# Patient Record
Sex: Female | Born: 1980
Health system: Southern US, Community
[De-identification: ages and names within clinical notes are randomized; demographics above are authoritative.]

## PROBLEM LIST (undated history)

## (undated) DIAGNOSIS — Z85850 Personal history of malignant neoplasm of thyroid: Secondary | ICD-10-CM

## (undated) DIAGNOSIS — Z973 Presence of spectacles and contact lenses: Secondary | ICD-10-CM

## (undated) DIAGNOSIS — N939 Abnormal uterine and vaginal bleeding, unspecified: Secondary | ICD-10-CM

## (undated) DIAGNOSIS — E890 Postprocedural hypothyroidism: Secondary | ICD-10-CM

## (undated) DIAGNOSIS — F419 Anxiety disorder, unspecified: Secondary | ICD-10-CM

## (undated) DIAGNOSIS — D250 Submucous leiomyoma of uterus: Secondary | ICD-10-CM

## (undated) DIAGNOSIS — E785 Hyperlipidemia, unspecified: Secondary | ICD-10-CM

## (undated) DIAGNOSIS — R000 Tachycardia, unspecified: Secondary | ICD-10-CM

## (undated) DIAGNOSIS — C801 Malignant (primary) neoplasm, unspecified: Secondary | ICD-10-CM

## (undated) HISTORY — PX: TOTAL THYROIDECTOMY: SHX2547

## (undated) HISTORY — PX: THYROIDECTOMY, PARTIAL: SHX18

---

## 2002-09-25 ENCOUNTER — Other Ambulatory Visit: Admission: RE | Admit: 2002-09-25 | Discharge: 2002-09-25 | Payer: Self-pay | Admitting: Obstetrics and Gynecology

## 2002-12-16 ENCOUNTER — Other Ambulatory Visit: Admission: RE | Admit: 2002-12-16 | Discharge: 2002-12-16 | Payer: Self-pay | Admitting: Diagnostic Radiology

## 2003-01-20 ENCOUNTER — Encounter (INDEPENDENT_AMBULATORY_CARE_PROVIDER_SITE_OTHER): Payer: Self-pay | Admitting: Specialist

## 2003-01-20 ENCOUNTER — Observation Stay (HOSPITAL_COMMUNITY): Admission: RE | Admit: 2003-01-20 | Discharge: 2003-01-21 | Payer: Self-pay | Admitting: Surgery

## 2003-03-07 ENCOUNTER — Inpatient Hospital Stay (HOSPITAL_COMMUNITY): Admission: RE | Admit: 2003-03-07 | Discharge: 2003-03-08 | Payer: Self-pay | Admitting: Surgery

## 2003-03-07 ENCOUNTER — Encounter (INDEPENDENT_AMBULATORY_CARE_PROVIDER_SITE_OTHER): Payer: Self-pay | Admitting: Specialist

## 2003-04-28 ENCOUNTER — Encounter (HOSPITAL_COMMUNITY): Admission: RE | Admit: 2003-04-28 | Discharge: 2003-07-27 | Payer: Self-pay | Admitting: Endocrinology

## 2003-09-30 ENCOUNTER — Other Ambulatory Visit: Admission: RE | Admit: 2003-09-30 | Discharge: 2003-09-30 | Payer: Self-pay | Admitting: Obstetrics and Gynecology

## 2003-12-09 ENCOUNTER — Ambulatory Visit: Payer: Self-pay | Admitting: Internal Medicine

## 2003-12-23 ENCOUNTER — Ambulatory Visit: Payer: Self-pay | Admitting: Internal Medicine

## 2004-02-25 ENCOUNTER — Ambulatory Visit: Payer: Self-pay | Admitting: Internal Medicine

## 2004-03-24 ENCOUNTER — Ambulatory Visit: Payer: Self-pay | Admitting: Internal Medicine

## 2004-04-21 ENCOUNTER — Ambulatory Visit: Payer: Self-pay | Admitting: Internal Medicine

## 2004-04-26 ENCOUNTER — Encounter (HOSPITAL_COMMUNITY): Admission: RE | Admit: 2004-04-26 | Discharge: 2004-07-25 | Payer: Self-pay | Admitting: Endocrinology

## 2004-07-29 ENCOUNTER — Ambulatory Visit: Payer: Self-pay | Admitting: Internal Medicine

## 2004-12-14 ENCOUNTER — Other Ambulatory Visit: Admission: RE | Admit: 2004-12-14 | Discharge: 2004-12-14 | Payer: Self-pay | Admitting: Obstetrics and Gynecology

## 2005-01-19 ENCOUNTER — Ambulatory Visit (HOSPITAL_COMMUNITY): Admission: RE | Admit: 2005-01-19 | Discharge: 2005-01-19 | Payer: Self-pay | Admitting: Endocrinology

## 2005-02-21 ENCOUNTER — Encounter (HOSPITAL_COMMUNITY): Admission: RE | Admit: 2005-02-21 | Discharge: 2005-05-22 | Payer: Self-pay | Admitting: Endocrinology

## 2005-04-21 ENCOUNTER — Ambulatory Visit: Payer: Self-pay | Admitting: Internal Medicine

## 2005-09-22 ENCOUNTER — Ambulatory Visit: Payer: Self-pay | Admitting: Internal Medicine

## 2005-10-24 ENCOUNTER — Encounter: Admission: RE | Admit: 2005-10-24 | Discharge: 2005-10-24 | Payer: Self-pay | Admitting: Endocrinology

## 2005-10-26 ENCOUNTER — Encounter: Admission: RE | Admit: 2005-10-26 | Discharge: 2005-10-26 | Payer: Self-pay | Admitting: Endocrinology

## 2005-11-11 ENCOUNTER — Ambulatory Visit (HOSPITAL_COMMUNITY): Admission: RE | Admit: 2005-11-11 | Discharge: 2005-11-11 | Payer: Self-pay | Admitting: Endocrinology

## 2005-12-13 ENCOUNTER — Ambulatory Visit: Payer: Self-pay | Admitting: Internal Medicine

## 2005-12-13 ENCOUNTER — Emergency Department (HOSPITAL_COMMUNITY): Admission: EM | Admit: 2005-12-13 | Discharge: 2005-12-13 | Payer: Self-pay | Admitting: Emergency Medicine

## 2005-12-14 ENCOUNTER — Encounter: Payer: Self-pay | Admitting: Cardiology

## 2005-12-14 ENCOUNTER — Ambulatory Visit: Payer: Self-pay

## 2005-12-21 ENCOUNTER — Encounter: Admission: RE | Admit: 2005-12-21 | Discharge: 2005-12-21 | Payer: Self-pay | Admitting: Endocrinology

## 2006-03-09 ENCOUNTER — Ambulatory Visit: Payer: Self-pay | Admitting: Internal Medicine

## 2006-09-12 ENCOUNTER — Encounter: Admission: RE | Admit: 2006-09-12 | Discharge: 2006-09-12 | Payer: Self-pay | Admitting: Endocrinology

## 2006-09-21 ENCOUNTER — Encounter: Admission: RE | Admit: 2006-09-21 | Discharge: 2006-09-21 | Payer: Self-pay | Admitting: Endocrinology

## 2006-09-21 ENCOUNTER — Other Ambulatory Visit: Admission: RE | Admit: 2006-09-21 | Discharge: 2006-09-21 | Payer: Self-pay | Admitting: Interventional Radiology

## 2006-09-21 ENCOUNTER — Encounter (INDEPENDENT_AMBULATORY_CARE_PROVIDER_SITE_OTHER): Payer: Self-pay | Admitting: Interventional Radiology

## 2006-10-05 ENCOUNTER — Ambulatory Visit (HOSPITAL_COMMUNITY): Admission: RE | Admit: 2006-10-05 | Discharge: 2006-10-05 | Payer: Self-pay | Admitting: Endocrinology

## 2006-11-03 ENCOUNTER — Ambulatory Visit: Payer: Self-pay | Admitting: Internal Medicine

## 2006-11-03 DIAGNOSIS — B308 Other viral conjunctivitis: Secondary | ICD-10-CM | POA: Insufficient documentation

## 2007-02-05 ENCOUNTER — Ambulatory Visit: Payer: Self-pay | Admitting: Internal Medicine

## 2007-02-05 DIAGNOSIS — J069 Acute upper respiratory infection, unspecified: Secondary | ICD-10-CM | POA: Insufficient documentation

## 2007-02-09 ENCOUNTER — Telehealth: Payer: Self-pay | Admitting: Internal Medicine

## 2007-12-16 ENCOUNTER — Inpatient Hospital Stay (HOSPITAL_COMMUNITY): Admission: AD | Admit: 2007-12-16 | Discharge: 2007-12-18 | Payer: Self-pay | Admitting: Obstetrics and Gynecology

## 2008-01-28 IMAGING — US US SOFT TISSUE HEAD/NECK
1 series · 13 of 13 positions shown · non-contrast
Comparison: 01/19/05

CLINICAL DATA: History thyroidectomy for thyroid cancer.
 ULTRASOUND SOFT TISSUE HEAD/NECK:
TECHNIQUE: Multiplanar gray scale ultrasound was performed of the thyroidectomy bed and surrounding soft tissues.

[Series 1: us soft tissue head/neck · 0.09mm/px · 13 of 13 slices shown]
[im 1/13]
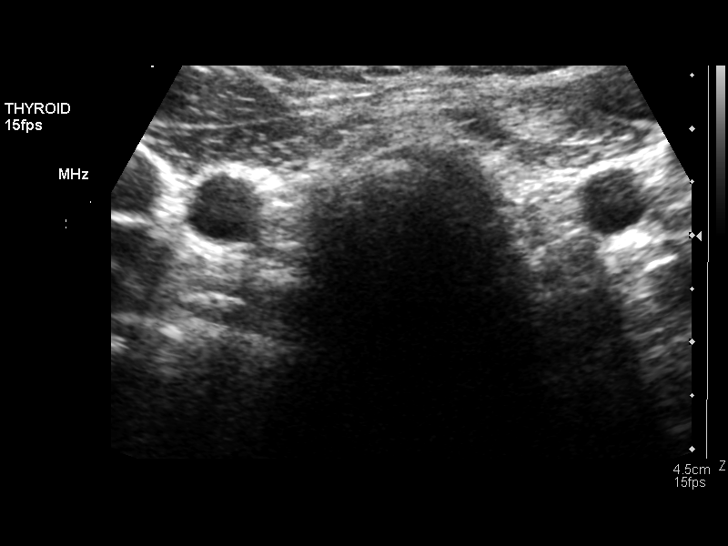
[im 2/13]
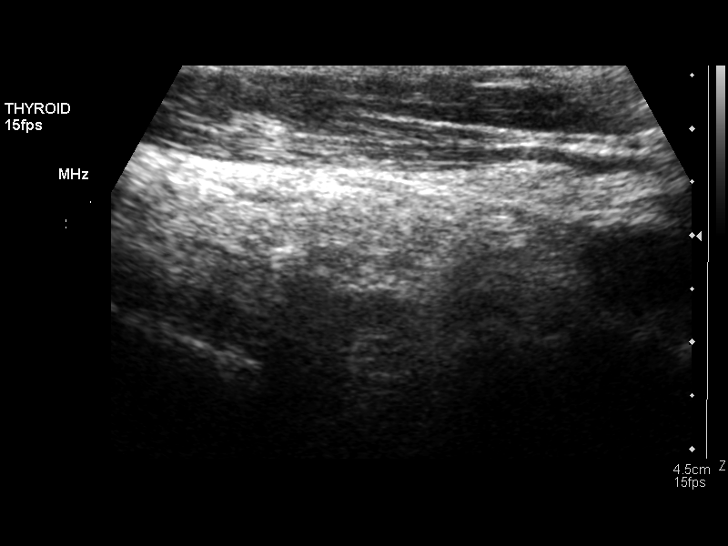
[im 3/13]
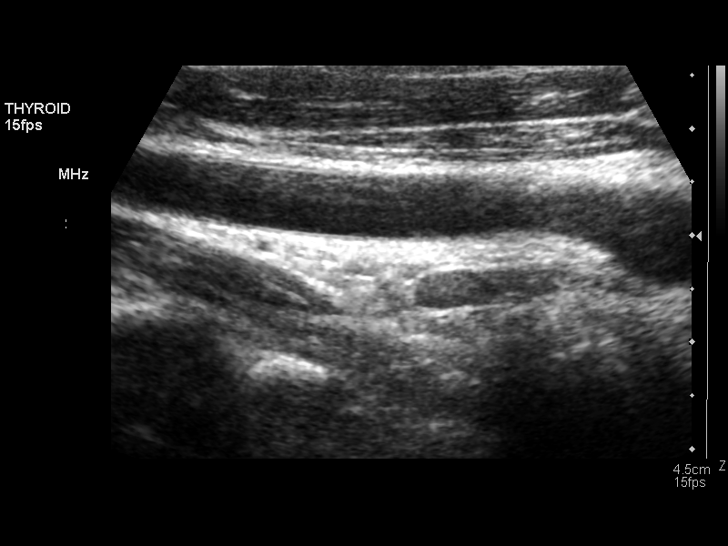
[im 4/13]
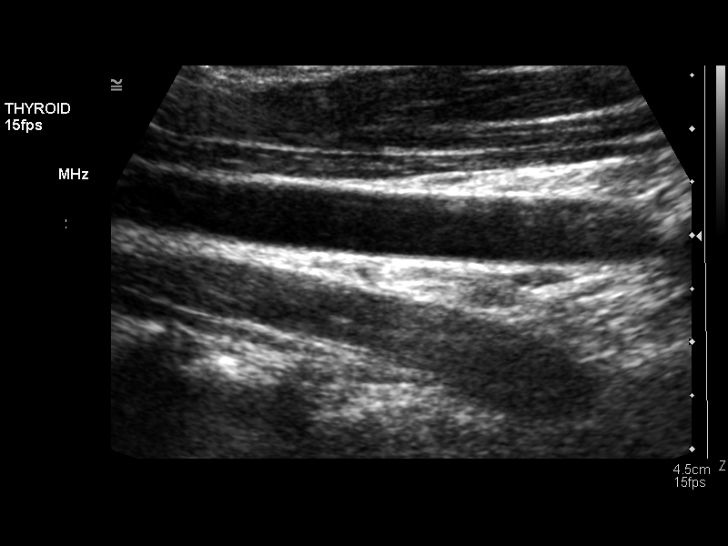
[im 5/13]
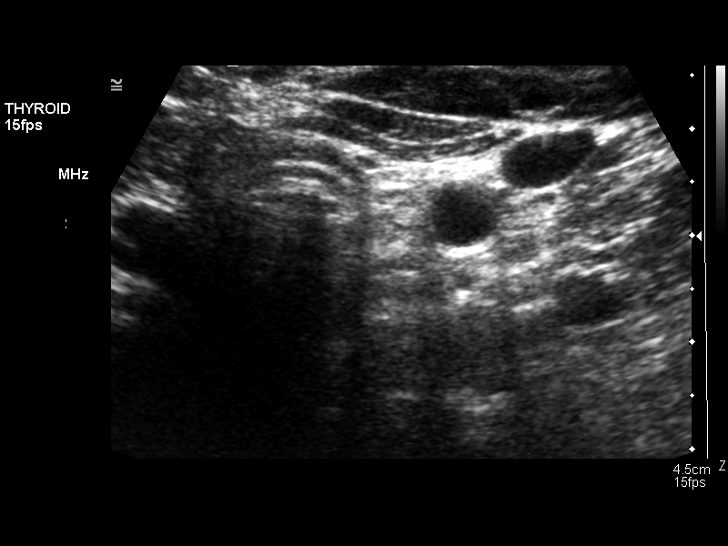
[im 6/13]
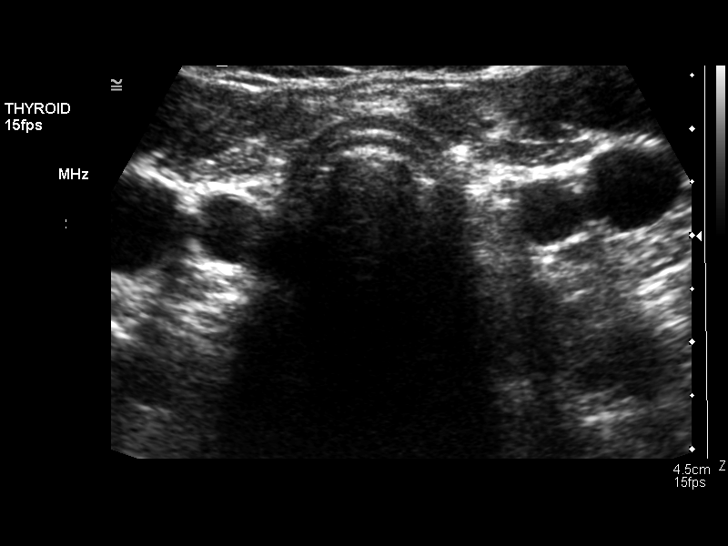
[im 7/13]
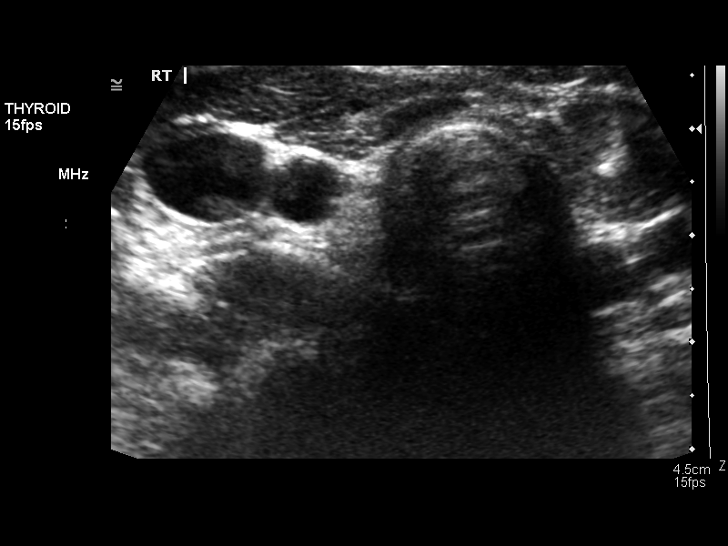
[im 8/13]
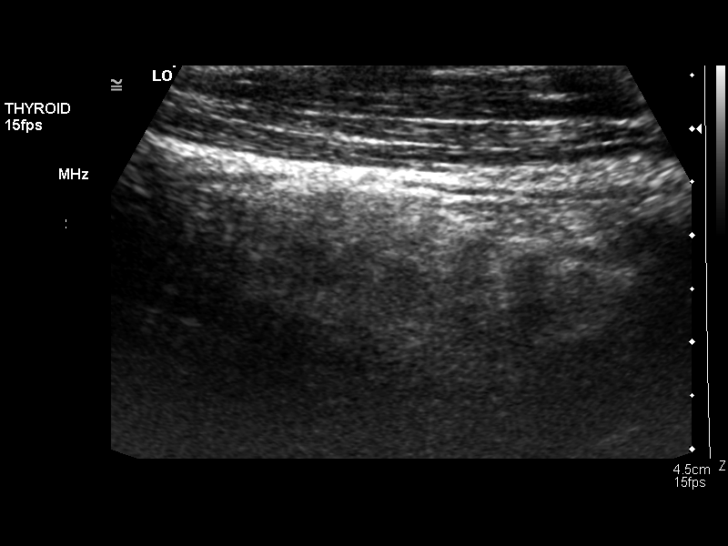
[im 9/13]
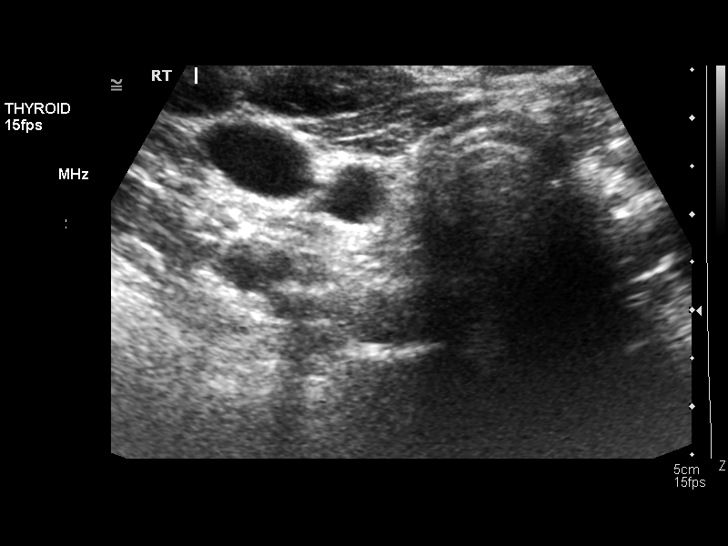
[im 10/13]
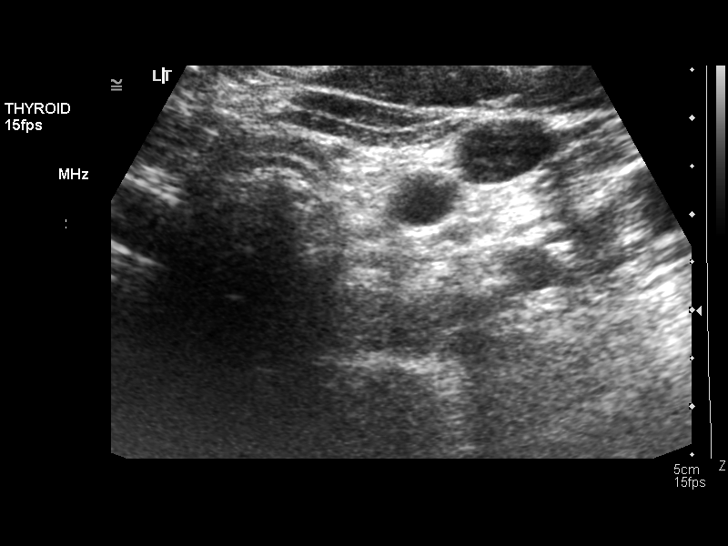
[im 11/13]
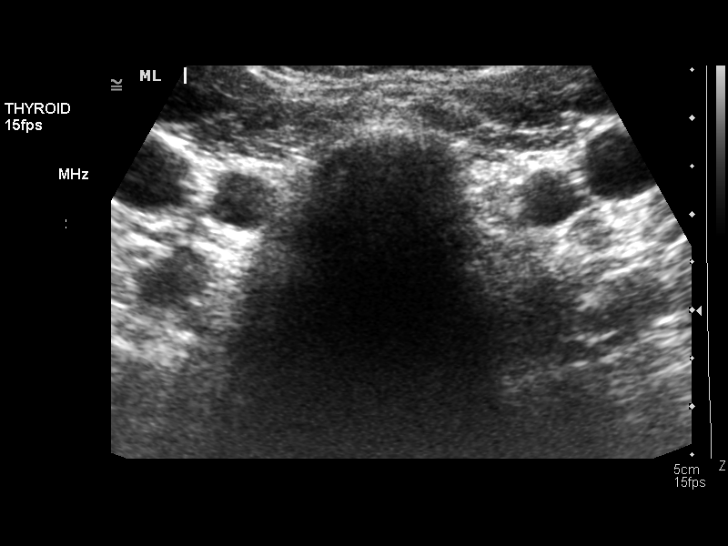
[im 12/13]
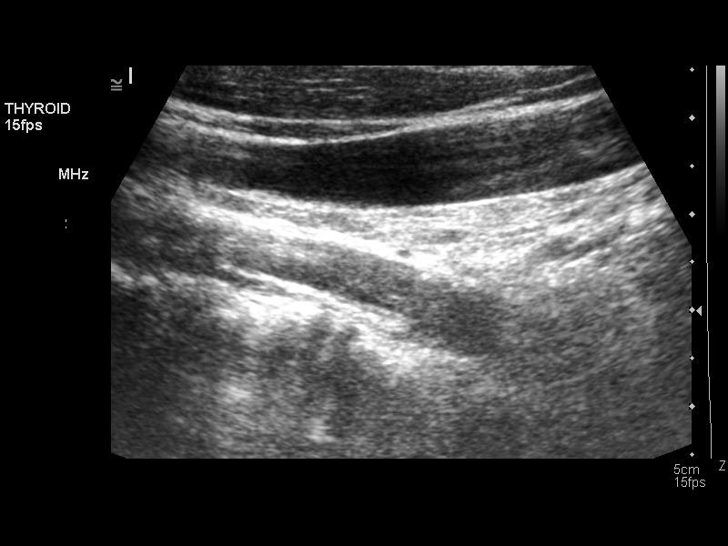
[im 13/13]
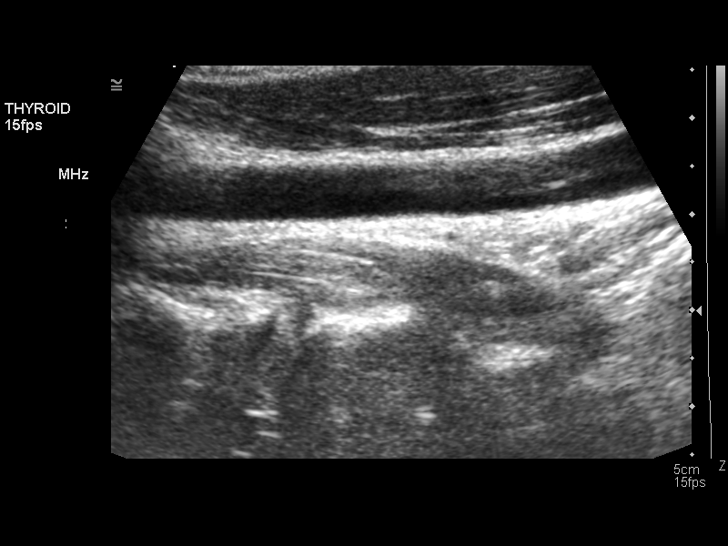

[13 of 13 positions shown; findings below may reference images not displayed]

FINDINGS: The patient is status post thyroidectomy.  There is no ultrasound evidence of mass in the surgical bed to suggest local recurrence.  No surrounding enlarged lymph nodes are identified.
IMPRESSION: Stable appearance of the neck status post thyroidectomy.

## 2008-05-14 ENCOUNTER — Ambulatory Visit: Payer: Self-pay | Admitting: Internal Medicine

## 2008-05-14 DIAGNOSIS — L919 Hypertrophic disorder of the skin, unspecified: Secondary | ICD-10-CM

## 2008-05-14 DIAGNOSIS — L909 Atrophic disorder of skin, unspecified: Secondary | ICD-10-CM | POA: Insufficient documentation

## 2008-08-18 ENCOUNTER — Ambulatory Visit: Payer: Self-pay | Admitting: Internal Medicine

## 2008-08-18 LAB — CONVERTED CEMR LAB: Rapid Strep: NEGATIVE

## 2008-09-08 ENCOUNTER — Encounter (HOSPITAL_COMMUNITY): Admission: RE | Admit: 2008-09-08 | Discharge: 2008-10-23 | Payer: Self-pay | Admitting: Endocrinology

## 2008-12-25 IMAGING — US US BIOPSY
1 series · 14 of 16 positions shown · non-contrast
Comparison: none

CLINICAL DATA: Thyroid carcinoma status post thyroidectomy. Elevated
thyroglobulin. PET-CT and Almi scintigraphy negative. Recent ultrasound
demonstrated bilateral cervical lymph nodes.

ULTRASOUND GUIDED CERVICAL ASPIRATION BIOPSY x2:
TECHNIQUE: Survey ultrasound of the neck was performed and dominant right and
left cervical lymph nodes were identified. Overlying skin prepped with Betadine,
draped in usual sterile fashion, and infiltrated locally with 1% lidocaine.
Under real-time ultrasound guidance, 3  passes were made into a prominent normal
sized right-sided cervical node anterior to the carotid bifurcation using 25
gauge needles.  In similar fashion, under real-time ultrasound guidance, 3 
passes were made into a prominent left-sided cervical node just inferior to the
level of the carotid bifurcation using 25 gauge needles. Samples were given to
cytopathology. No immediate complication.

[Series 1: us biopsy · 16 acquisitions, 14 frames shown]
[im 1/16]
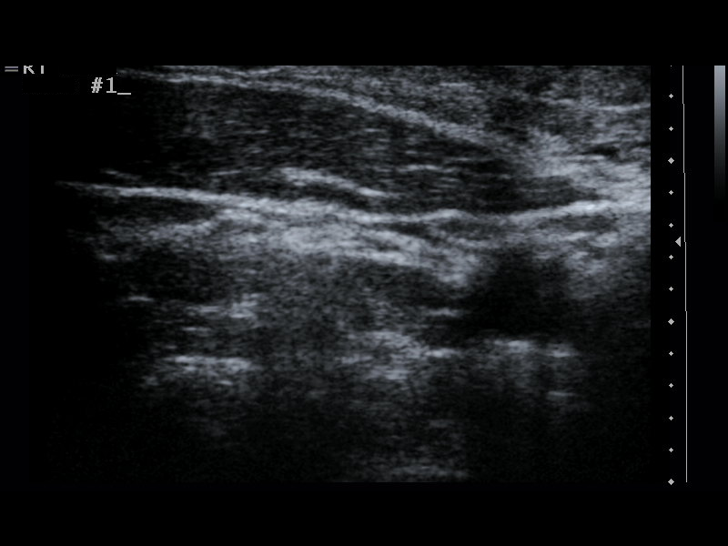
[im 2/16]
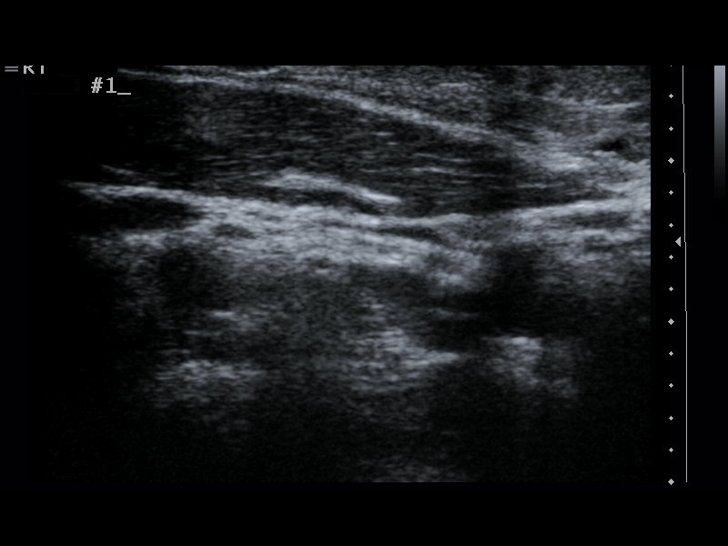
[im 3/16]
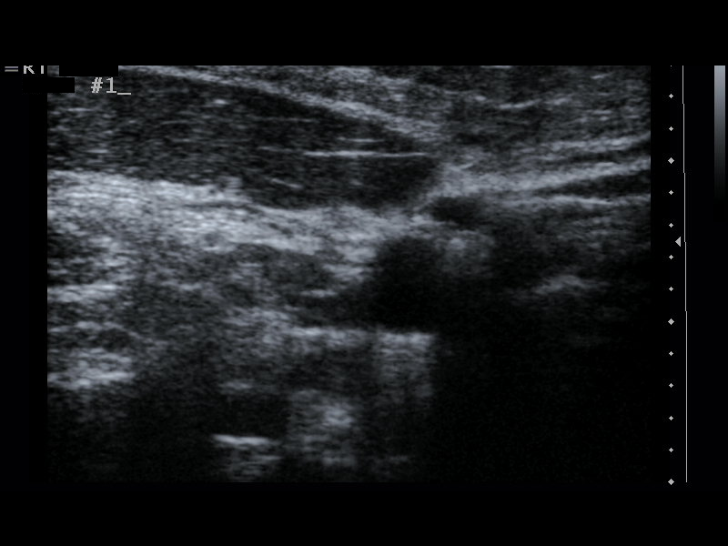
[im 5/16]
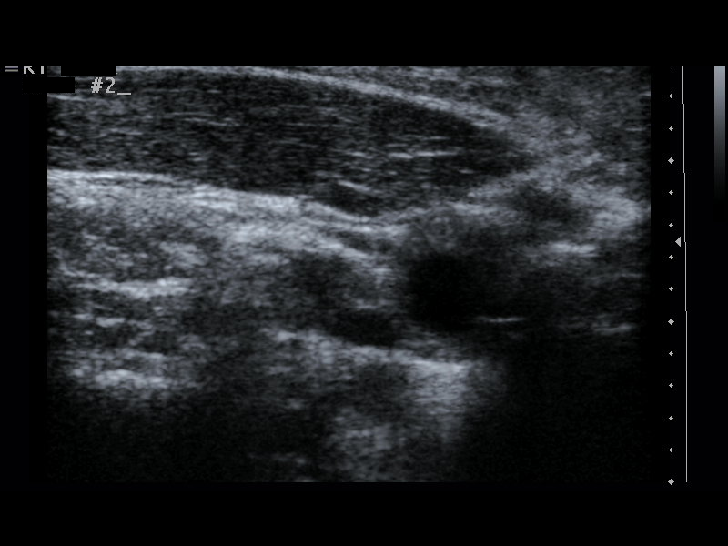
[im 6/16]
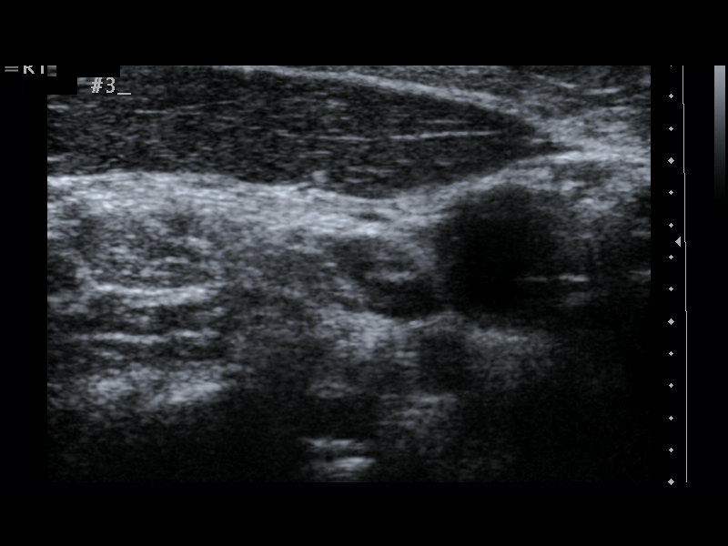
[im 7/16]
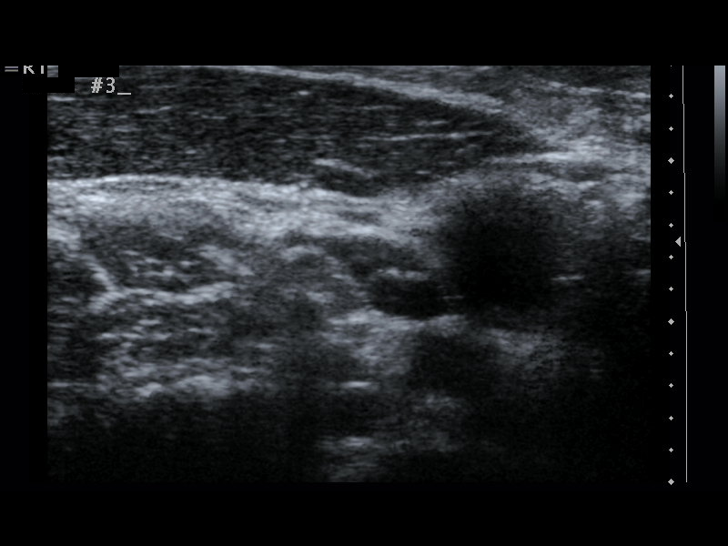
[im 8/16]
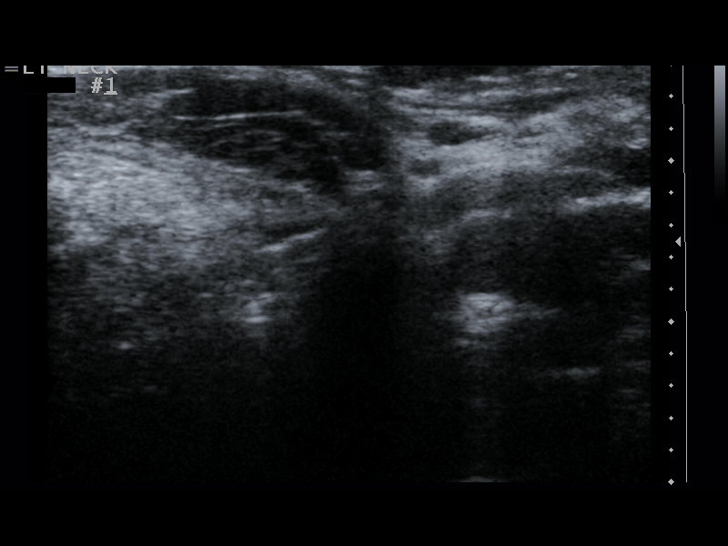
[im 9/16]
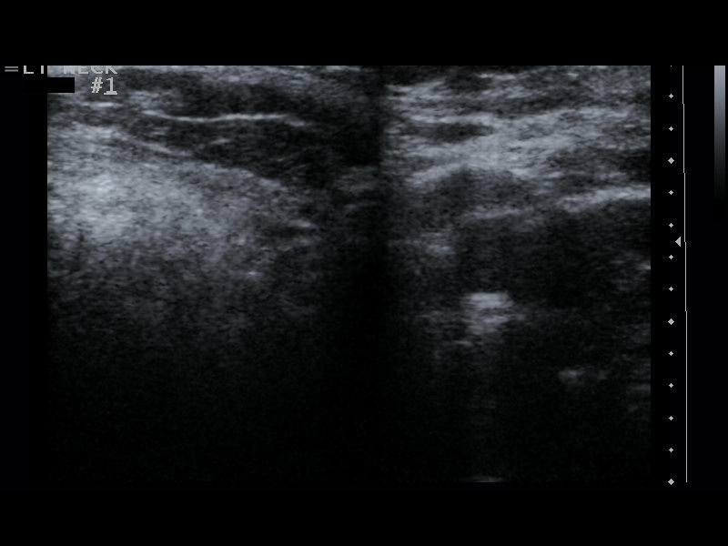
[im 10/16]
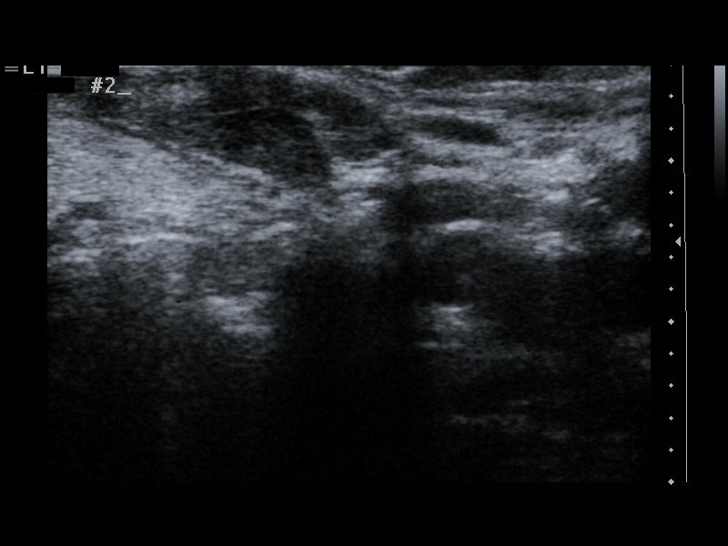
[im 11/16]
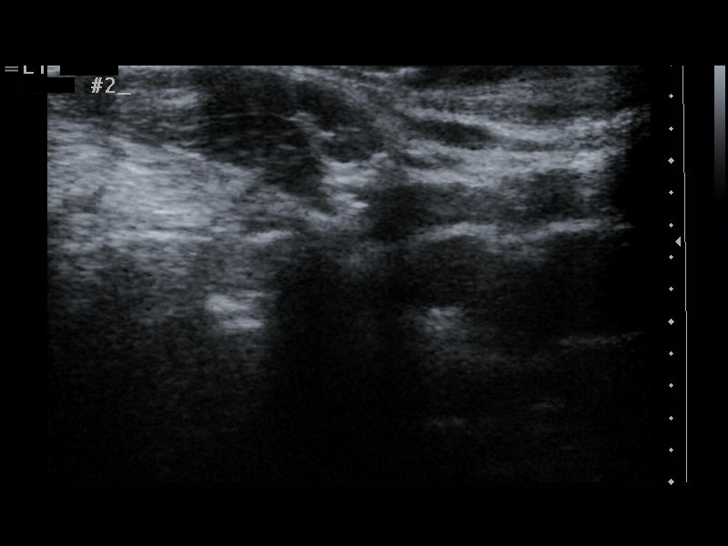
[im 13/16]
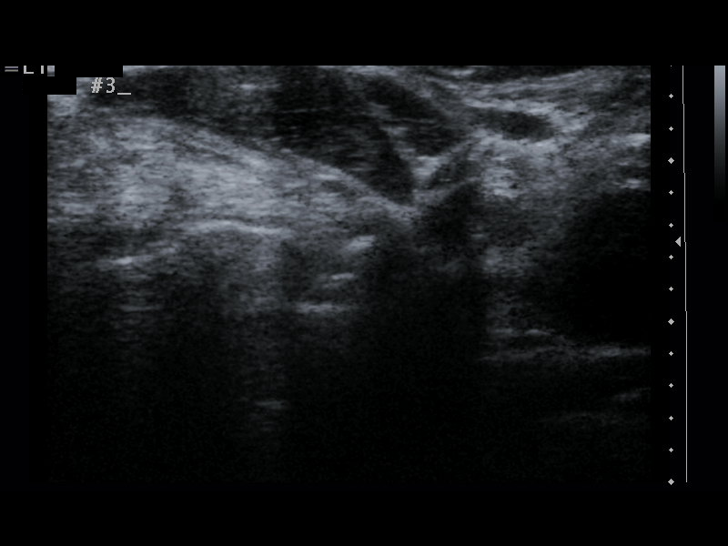
[im 14/16]
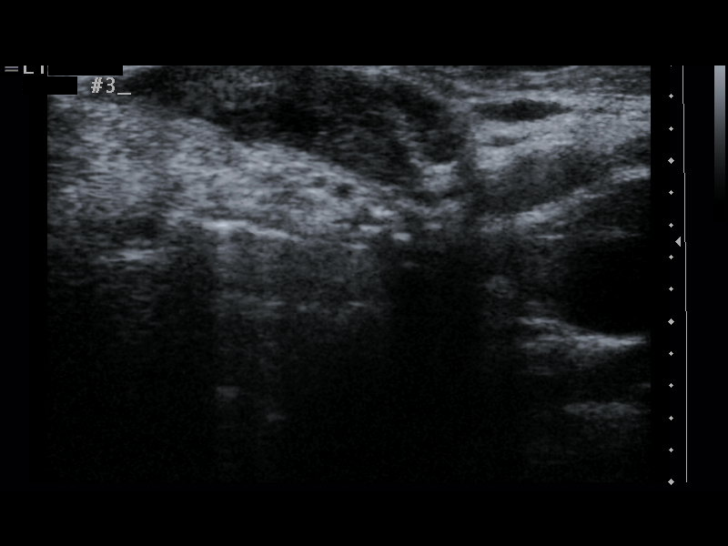
[im 15/16]
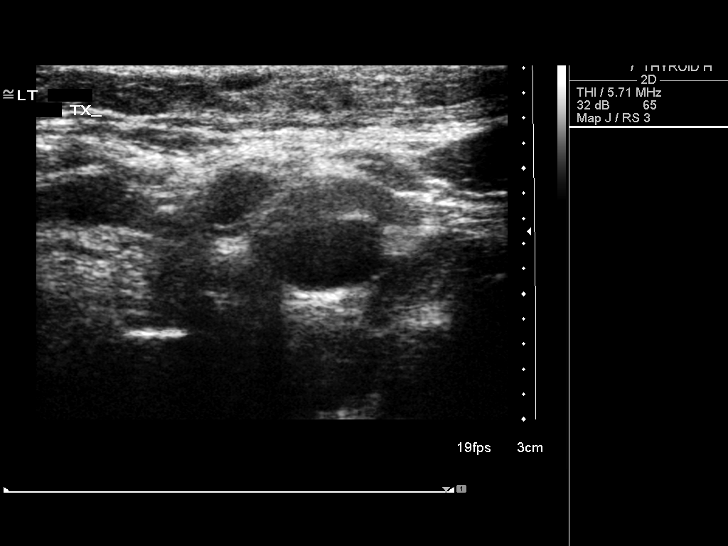
[im 16/16]
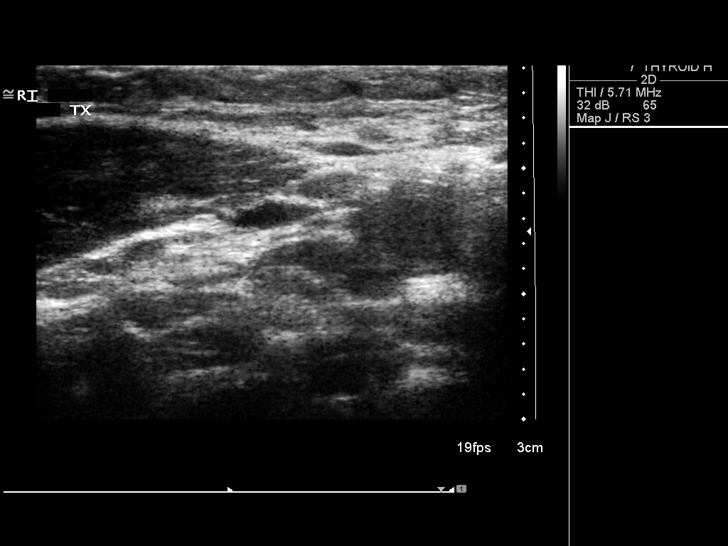

[14 of 16 positions shown; findings below may reference images not displayed]

IMPRESSION: Technically successful ultrasound guided FNA cervical biopsy x2.

## 2009-07-29 ENCOUNTER — Ambulatory Visit: Payer: Self-pay | Admitting: Family Medicine

## 2010-01-24 DIAGNOSIS — R000 Tachycardia, unspecified: Secondary | ICD-10-CM

## 2010-01-24 HISTORY — DX: Tachycardia, unspecified: R00.0

## 2010-01-24 NOTE — L&D Delivery Note (Signed)
Delivery Note Pt C/C/+2, with no pressure, pushed 3-4times for delivery.  At 3:58 PM a viable and healthy female was delivered via Vaginal, Spontaneous Delivery (Presentation: ; Occiput Anterior).  APGAR: 8, 9; weight 6#12   Placenta status: , .  Cord: 3 vessels with the following complications: None.   Anesthesia: Epidural  Episiotomy: None Lacerations: 2nd degree Suture Repair: 3.0 vicryl rapide Est. Blood Loss (mL): 400  Mom to postpartum.  Baby to nursery-stable.  O+. Breastfeeding, Mirena IUD, desires circ for female - d/w pt R/B/A, wishes to proceed.    BOVARD,JODY 09/23/2010, 4:26 PM

## 2010-02-13 ENCOUNTER — Encounter: Payer: Self-pay | Admitting: Endocrinology

## 2010-02-23 NOTE — Assessment & Plan Note (Signed)
Summary: EAR PAIN, ST, H/A // RS   Vital Signs:  Patient profile:   30 year old female Weight:      122 pounds Temp:     98.2 degrees F oral BP sitting:   110 / 70  (left arm) Cuff size:   regular  Vitals Entered By: Kathrynn Speed CMA (July 29, 2009 3:39 PM) CC: Pain in both ears for 5 days, headaches, sorethroat, feeling tired for 2 days /src   History of Present Illness: Patient is a work in with 2 day history of bilateral ear pain, sore throat, and intermittent headaches. 2-3 week history of intermittent nasal congestion and postnasal drip symptoms. She has felt more fatigued. No purulent secretions. Denies fever. Prior history of abdominal cramping with Zithromax but denies prior allergy to PCN.  Current Medications (verified): 1)  Levoxyl 150 Mcg  Tabs (Levothyroxine Sodium) .... Take 1 Tablet By Mouth Once A Day During The Week & 175 Mg On Weekends 2)  Sertraline Hcl 50 Mg  Tabs (Sertraline Hcl) .... Once A Day  Allergies (verified): 1)  ! Zithromax Z-Pak (Azithromycin)  Review of Systems      See HPI  Physical Exam  General:  Well-developed,well-nourished,in no acute distress; alert,appropriate and cooperative throughout examination Head:  tender over maxillary sinuses bilaterally Ears:  External ear exam shows no significant lesions or deformities.  Otoscopic examination reveals clear canals, tympanic membranes are intact bilaterally without bulging, retraction, inflammation or discharge. Hearing is grossly normal bilaterally. Mouth:  Oral mucosa and oropharynx without lesions or exudates.  Teeth in good repair. Neck:  No deformities, masses, or tenderness noted. Lungs:  Normal respiratory effort, chest expands symmetrically. Lungs are clear to auscultation, no crackles or wheezes. Heart:  Normal rate and regular rhythm. S1 and S2 normal without gallop, murmur, click, rub or other extra sounds.   Impression & Recommendations:  Problem # 1:  URI (ICD-465.9) possible  acute sinusitis.  Complete Medication List: 1)  Levoxyl 150 Mcg Tabs (levothyroxine Sodium)  .... Take 1 tablet by mouth once a day during the week & 175 mg on weekends 2)  Sertraline Hcl 50 Mg Tabs (sertraline Hcl)  .... Once a day 3)  Amoxicillin 875 Mg Tabs (Amoxicillin) .... One by mouth two times a day for 10 days  Patient Instructions: 1)  Take your antibiotic as prescribed until ALL of it is gone, but stop if you develop a rash or swelling and contact our office as soon as possible.  Prescriptions: AMOXICILLIN 875 MG TABS (AMOXICILLIN) one by mouth two times a day for 10 days  #20 x 0   Entered and Authorized by:   Evelena Peat MD   Signed by:   Evelena Peat MD on 07/29/2009   Method used:   Electronically to        [facility] [address] (retail)       [address]       [place], [place]  [address]       Ph: [phone]       Fax: [phone]   RxID:   [id number]

## 2010-02-25 LAB — RPR: RPR: NONREACTIVE

## 2010-02-25 LAB — RUBELLA ANTIBODY, IGM: Rubella: IMMUNE

## 2010-05-01 LAB — HCG, SERUM, QUALITATIVE: Preg, Serum: NEGATIVE

## 2010-06-08 NOTE — Discharge Summary (Signed)
[name], [name]            ACCOUNT NO.:  [id number]   MEDICAL RECORD NO.:  [id number]          PATIENT TYPE:  INP   LOCATION:  9125                          FACILITY:  [facility]   PHYSICIAN:  Sherron Monday, MD        DATE OF BIRTH:  1980/08/31   DATE OF ADMISSION:  12/16/2007  DATE OF DISCHARGE:  12/18/2007                               DISCHARGE SUMMARY   ADMITTING DIAGNOSIS:  Intrauterine pregnancy at term with spontaneous  rupture of membranes.   DISCHARGE DIAGNOSES:  Intrauterine pregnancy at term with spontaneous  rupture of membranes, infant delivered via spontaneous vaginal delivery.   HISTORY OF PRESENT ILLNESS:  A 30 year old G1, P0 at 37 weeks with  spontaneous rupture of membranes at 2:46 a.m. with clear fluid and pink  on the toilet tissue.  She states she has had a good fetal movement,  occasional contractions, and uncomplicated prenatal care.   PAST MEDICAL HISTORY:  Significant for thyroid cancer as well as  depression and anxiety.   PAST SURGICAL HISTORY:  Significant for thyroid surgery x2 as well as a  wisdom tooth extraction.   PAST OB/GYN HISTORY:  G1 is the present pregnancy.  No abnormal Pap  smears or sexually transmitted diseases.   MEDICATIONS:  Prenatal vitamins, as well as Zoloft, as well as iron, as  well as Synthroid.   ALLERGIES:  AMOXICILLIN and a Z-PAK.   SOCIAL HISTORY:  She denies alcohol, tobacco, or drug use.  She is  married and works for a [employer].   FAMILY HISTORY:  Significant for hypertension in maternal grandmother,  maternal aunt with leukemia, and diabetes in her father.   PRENATAL LABORATORY DATA:  Hemoglobin 12.2 and platelets 264,000.  O  positive, antibody screen negative, gonorrhea negative, Chlamydia  negative, RPR nonreactive, and rubella immune.  Cystic fibrosis screen  negative.  Hepatitis B surface antigen negative.  TSH was 31.46 and T4  was 0.83.  Earlier at the pregnancy, Dr. Talmage Nap adjusted her Synthroid  medicine to accommodate this.  HIV was nonreactive.  Glucola of 118.  Group B strep was negative.  AFP was within normal limits.   Ultrasound at 18 weeks on August 02, 2007, revealed an estimated fetal  weight of 236 g.  Normal anatomy except an EIF.   Posterior placenta, female infant   On admission, benign exam.  Fetal heart tones in the 150s and reactive  with contractions every 2-4 minutes.  Her vaginal exam at this time was  2-3 cm dilated, 80% effaced, and -1 station.  She had Pitocin to augment  her labor.  She progressed to complete +2 and pushed very well for  approximately 30 minutes to deliver a viable female infant at 2022.  Apgars of 8 at 1 minute 9 at 5 minutes and weighed 6 pounds and 10  ounces.  She had second degree perineal laceration that was repaired  with 3-0 Vicryl Rapide in a typical fashion.  EBL was approximately 500  mL.  Postpartum course was relatively uncomplicated.  She remained  afebrile and vital signs stable with a benign exam throughout.  On  postpartum day #2, she had normal lochia.  Pain was controlled.  She was  ambulating well.  Plan was for discharge to home.  At this time, she was  given written discharge instructions and numbers to call with any  questions or problems as well as prescriptions for Motrin, Vicodin, and  prenatal vitamins.  Hemoglobin decreased from 13.5 to 10.3.  She is O  positive and rubella immune.  Hemoglobin 13.5 to 7.3 and she plans to  breastfeed.  She wants Micronor for contraception, was given a  prescription for this prior to discharge.      Sherron Monday, MD  Electronically Signed     JB/MEDQ  D:  12/18/2007  T:  12/18/2007  Job:  [id number]

## 2010-06-11 NOTE — Op Note (Signed)
NAME:  Kristen, Cordova                      ACCOUNT NO.:  [id number]   MEDICAL RECORD NO.:  [id number]                   PATIENT TYPE:  AMB   LOCATION:  DAY                                  FACILITY:  [facility]   PHYSICIAN:  Thornton Park. Daphine Deutscher, M.D.             DATE OF BIRTH:  February 03, 1980   DATE OF PROCEDURE:  01/20/2003  DATE OF DISCHARGE:                                 OPERATIVE REPORT   PREOPERATIVE DIAGNOSIS:  Palpable mass midline of thyroid that is movable,  previous needle aspirate showing some follicular cells.   POSTOPERATIVE DIAGNOSIS:  Palpable mass midline of thyroid that is movable,  previous needle aspirate showing some follicular cells.   OPERATION/PROCEDURE:  Partial thyroidectomy (isthmusectomy of thyroid for  nodule).   SURGEON:  Thornton Park. Daphine Deutscher, M.D.   ASSISTANT:  Lebron Conners, M.D.   ANESTHESIA:  General endotracheal anesthesia.   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  This has been noted for  several years.  Recently it has been worked up with a needle aspirate that  showed some papillary cells.   Informed consent was obtained in the office with her and her mother and we  discussed the game plan involving basically excising the nodule since we  felt it was probably coming off the isthmus and would defer any subsequent  more aggressive surgery to permanent sections.   The patient was taken to the Room #1 and placed in the sniffing position  with the neck well prepped with Betadine from chin to breast and laterally  back to the back of the neck.  She was draped accordingly.  The mass was  easily palpable.  I made a small transverse incision about two  fingerbreadths above the notch, right overlying the nodule and cut down  through the platysma down to the midline.  Identified the midline and  dissected that and was able to retract the muscles laterally.  The nodule  was  then carefully dissected free and it popped on up into the wound and  appeared to be arising from the isthmus.  I had that mobilized and it seemed  to be well above it and used electrocautery then to remove it from the right  lobe and from the left lobe.  This left a vacancy of the isthmus where this  was removed.  The thyroid remnants on either side were oversewn with figure-  of-eight sutures of 4-0 Vicryl.   The nodule was then sent for frozen section by Dr. Star Age.  In the  meantime I approximated the sternothyroid muscles in the midline with 4-0  Vicryl.  I approximated the platysma with 4-0 Vicryl, then closed the skin  with interrupted subcuticular 5-0 Vicryl with Benzoin and Steri-Strips.   I then went over and looked at the specimen with Dr. Star Age.  We looked  and we  appeared to have an adequate margin and this appeared to be a benign  nodule.  We certainly will defer to permanents on that.  I then went back to  the room.  The patient was awakened and taken to the recovery room in  satisfactory condition.   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  Permanent  sections pending.                                               Thornton Park Daphine Deutscher, M.D.    MBM/MEDQ  D:  01/20/2003  T:  01/20/2003  Job:  [id number]   cc:   Stacie Glaze, M.D. [facility]

## 2010-06-11 NOTE — Op Note (Signed)
NAME:  [name], [name]                      ACCOUNT NO.:  [id number]   MEDICAL RECORD NO.:  [id number]                   PATIENT TYPE:  INP   LOCATION:  X001                                 FACILITY:  [facility]   PHYSICIAN:  Thornton Park. Daphine Deutscher, M.D.             DATE OF BIRTH:  Feb 02, 1980   DATE OF PROCEDURE:  03/07/2003  DATE OF DISCHARGE:                                 OPERATIVE REPORT   PREOPERATIVE DIAGNOSIS:  Papillary carcinoma of the thyroid.   PROCEDURE:  Total thyroidectomy.   SURGEON:  Thornton Park. Daphine Deutscher, M.D.   ASSISTANT:  Velora Heckler, M.D.   ANESTHESIA:  General endotracheal.   DESCRIPTION OF PROCEDURE:  [name] is a 30 year old young lady who  underwent a thyroid isthmusectomy for a nodule done on January 20, 2003.  This was sent off to [facility] and returned with a diagnosis of papillary  adenocarcinoma, oncocytic encapsulated variant.  The tumor was 1.6 cm in  size.   Informed consent was obtained regarding total thyroidectomy, including risk  to parathyroids and recurrent nerves.   [name] was taken back to room 11 and given general anesthesia.  She was  placed in the sniffing position with a roll between her shoulders.  An  incision was made from strap muscle to strap muscle, excising her previous  scar.  I carried this down to a fair amount of scar in the midline and was  able to raise flaps superiorly and inferiorly.  Strap muscles were  identified and divided in the midline and then they were retracted laterally  as I did first the left lobe.  This also had a little thyroidema branch  going up, which I removed, along with the total left lobe.  We stayed right  down on the gland, dividing the vessels with little clips and using  electrocautery.  We avoided the nerve and the parathyroid glands on that  side.  The gland was then removed in the midline using electrocautery.   The right lobe was similarly mobilized using the Kitner dissectors to  tease  it away from the surrounding tissue.  The superior pole was ligated, as was  the right side, using a 2-0 silk and then clips and then divided.  On this  side there was a little remnant of thyroid that went down and I cut that out  and in so doing, identified one of the parathyroids, which we maintained in  situ and did not seem to disrupt the blood supply to that one.  We could see  the recurrent nerve better on the right side as it entered, and it was  preserved.  Again, the right lobe was detached from the midline with the  Bovie electrocautery.  The areas were irrigated with saline.  No active  oozing or bleeding was noted.  I did put Surgicel on both sides.   The strap muscles were approximated in the midline with interrupted  sutures  of 4-0 Vicryl.  Subcutaneous 4-0 Vicryl was used to approximate the scar  remnant of the platysma muscle, and the skin was approximated with a running  subcuticular 5-0 Vicryl with Benzoin and Steri-Strips.  The patient seemed  to tolerate the procedure well.  She was taken to the recovery room in  satisfactory condition.                                               Thornton Park Daphine Deutscher, M.D.    MBM/MEDQ  D:  03/07/2003  T:  03/07/2003  Job:  [id number]   cc:   Stacie Glaze, M.D. [facility]

## 2010-07-16 ENCOUNTER — Emergency Department (HOSPITAL_COMMUNITY)
Admission: EM | Admit: 2010-07-16 | Discharge: 2010-07-16 | Disposition: A | Discharge status: Home / Self Care | Payer: 59 | Attending: Emergency Medicine | Admitting: Emergency Medicine

## 2010-07-16 DIAGNOSIS — E876 Hypokalemia: Secondary | ICD-10-CM | POA: Insufficient documentation

## 2010-07-16 DIAGNOSIS — R42 Dizziness and giddiness: Secondary | ICD-10-CM | POA: Insufficient documentation

## 2010-07-16 DIAGNOSIS — R079 Chest pain, unspecified: Secondary | ICD-10-CM | POA: Insufficient documentation

## 2010-07-16 DIAGNOSIS — R002 Palpitations: Secondary | ICD-10-CM | POA: Insufficient documentation

## 2010-07-16 DIAGNOSIS — R5383 Other fatigue: Secondary | ICD-10-CM | POA: Insufficient documentation

## 2010-07-16 DIAGNOSIS — O99891 Other specified diseases and conditions complicating pregnancy: Secondary | ICD-10-CM | POA: Insufficient documentation

## 2010-07-16 DIAGNOSIS — R5381 Other malaise: Secondary | ICD-10-CM | POA: Insufficient documentation

## 2010-07-16 LAB — DIFFERENTIAL
Basophils Absolute: 0 10*3/uL (ref 0.0–0.1)
Basophils Relative: 0 % (ref 0–1)
Eosinophils Absolute: 0.1 10*3/uL (ref 0.0–0.7)
Eosinophils Relative: 1 % (ref 0–5)
Lymphocytes Relative: 14 % (ref 12–46)
Lymphs Abs: 1.9 10*3/uL (ref 0.7–4.0)
Monocytes Absolute: 1.1 10*3/uL — ABNORMAL HIGH (ref 0.1–1.0)
Monocytes Relative: 8 % (ref 3–12)
Neutro Abs: 10.4 10*3/uL — ABNORMAL HIGH (ref 1.7–7.7)
Neutrophils Relative %: 77 % (ref 43–77)

## 2010-07-16 LAB — CBC
HCT: 29.6 % — ABNORMAL LOW (ref 36.0–46.0)
Hemoglobin: 10 g/dL — ABNORMAL LOW (ref 12.0–15.0)
MCH: 27.9 pg (ref 26.0–34.0)
MCHC: 33.8 g/dL (ref 30.0–36.0)
MCV: 82.7 fL (ref 78.0–100.0)
Platelets: 261 10*3/uL (ref 150–400)
RBC: 3.58 MIL/uL — ABNORMAL LOW (ref 3.87–5.11)
RDW: 13.2 % (ref 11.5–15.5)
WBC: 13.6 10*3/uL — ABNORMAL HIGH (ref 4.0–10.5)

## 2010-07-16 LAB — COMPREHENSIVE METABOLIC PANEL
ALT: 10 U/L (ref 0–35)
AST: 14 U/L (ref 0–37)
Albumin: 2.1 g/dL — ABNORMAL LOW (ref 3.5–5.2)
Alkaline Phosphatase: 103 U/L (ref 39–117)
BUN: 6 mg/dL (ref 6–23)
CO2: 25 mEq/L (ref 19–32)
Calcium: 8.7 mg/dL (ref 8.4–10.5)
Chloride: 100 mEq/L (ref 96–112)
Creatinine, Ser: <0.47 mg/dL — ABNORMAL LOW (ref 0.50–1.10)
Glucose, Bld: 79 mg/dL (ref 70–99)
Potassium: 3.2 mEq/L — ABNORMAL LOW (ref 3.5–5.1)
Sodium: 132 mEq/L — ABNORMAL LOW (ref 135–145)
Total Bilirubin: 0.3 mg/dL (ref 0.3–1.2)
Total Protein: 6.5 g/dL (ref 6.0–8.3)

## 2010-07-16 LAB — TROPONIN I: Troponin I: <0.3 ng/mL (ref <0.30)

## 2010-07-17 LAB — TSH: TSH: 6.286 u[IU]/mL — ABNORMAL HIGH (ref 0.350–4.500)

## 2010-07-17 LAB — T4, FREE: Free T4: 0.98 ng/dL (ref 0.80–1.80)

## 2010-08-16 ENCOUNTER — Ambulatory Visit: Payer: 59 | Admitting: Cardiology

## 2010-08-30 ENCOUNTER — Inpatient Hospital Stay (HOSPITAL_COMMUNITY)
Admission: AD | Admit: 2010-08-30 | Discharge: 2010-08-30 | Disposition: A | Discharge status: Home / Self Care | Payer: 59 | Source: Ambulatory Visit | Attending: Obstetrics and Gynecology | Admitting: Obstetrics and Gynecology

## 2010-08-30 ENCOUNTER — Encounter (HOSPITAL_COMMUNITY): Payer: Self-pay | Admitting: *Deleted

## 2010-08-30 DIAGNOSIS — O479 False labor, unspecified: Secondary | ICD-10-CM | POA: Insufficient documentation

## 2010-08-30 HISTORY — DX: Malignant (primary) neoplasm, unspecified: C80.1

## 2010-08-30 NOTE — Progress Notes (Signed)
Contractions q 4 minutes x 4 hours, "a lot of pressure". G2P1

## 2010-08-30 NOTE — Progress Notes (Signed)
C/O irregular contractions and general discomfort Contractions not tracing, fetal movement noted. Candise Che, RN.

## 2010-09-01 LAB — STREP B DNA PROBE: GBS: NEGATIVE

## 2010-09-20 ENCOUNTER — Telehealth (HOSPITAL_COMMUNITY): Payer: Self-pay | Admitting: *Deleted

## 2010-09-20 ENCOUNTER — Encounter (HOSPITAL_COMMUNITY): Payer: Self-pay | Admitting: *Deleted

## 2010-09-20 NOTE — Telephone Encounter (Signed)
Preadmission screen  

## 2010-09-22 ENCOUNTER — Other Ambulatory Visit: Payer: Self-pay | Admitting: Obstetrics and Gynecology

## 2010-09-22 NOTE — H&P (Signed)
[name] is an 30 y.o. female. G2P1001 at 39+ for iol given term status and favorable cervix.  +FM, no LOF, no VB, occ ctx; PNC complicated by h/o thyroid CA, s/p thyroidectomy followed by Dr. Talmage Nap, and heart palpitations  Pertinent Gynecological History: No abn pap, no STDs OB History: G2, P1001 G1 6#10 female, SVD   Menstrual History: No LMP recorded. Patient is pregnant.    Past Medical History  Diagnosis Date  . Cancer   . S/P thyroidectomy 2005    Hx of thyroid cancer  . Dysrhythmia     seeing cardiologist for tachycardia on meds  . Hypothyroidism     had thyroid cancer    Past Surgical History  Procedure Date  . Thyroidectomy 2004  . Mouth surgery     Family History  Problem Relation Age of Onset  . Diabetes Father   . Cancer Maternal Aunt     leaukemia  . Miscarriages / Stillbirths Maternal Aunt   . Hypertension Maternal Grandmother   . Asthma Maternal Grandfather   . Diabetes Maternal Grandfather     Social History:  reports that she has never smoked. She does not have any smokeless tobacco history on file. She reports that she does not drink alcohol or use illicit drugs. married  Allergies:  Allergies  Allergen Reactions  . Azithromycin Other (See Comments)    Stomach cramps  PCN?  Review of Systems  Constitutional: Negative.   HENT: Negative.   Eyes: Negative.   Respiratory: Negative.   Cardiovascular: Negative.   Gastrointestinal: Negative.   Genitourinary: Negative.   Musculoskeletal: Negative.   Skin: Negative.   Neurological: Negative.   Psychiatric/Behavioral: Negative.     AF VSS Physical Exam  Constitutional: She is oriented to person, place, and time. She appears well-developed and well-nourished.  HENT:  Head: Normocephalic and atraumatic.  Neck: Normal range of motion. Neck supple. No thyromegaly present.  Cardiovascular: Normal rate and regular rhythm.   Respiratory: Effort normal and breath sounds normal.  GI:  Soft. Bowel sounds are normal.       FNT  Musculoskeletal: Normal range of motion.  Neurological: She is alert and oriented to person, place, and time.    PNL O+, Ab Scrneg, Hgb 12.7, Pap WNL, Rubella Immune, RPR NR, HepBsAg neg, HIV neg, Plt 295K, GC neg, Chl neg, CF neg, gbbs neg, First Tri Scr WNL, AFP WNL, glucola 144, 3hr GTT WNL Assessment/Plan: 29yo G2P1001 at 39+ for IOL given term status and favorable cervix gbbs neg IOL with AROM, pitocin IV pain meds/epidural prn Expect SVD  BOVARD,JODY 09/22/2010, 4:56 PM

## 2010-09-23 ENCOUNTER — Inpatient Hospital Stay (HOSPITAL_COMMUNITY): Payer: 59 | Admitting: Anesthesiology

## 2010-09-23 ENCOUNTER — Encounter (HOSPITAL_COMMUNITY): Payer: Self-pay | Admitting: Anesthesiology

## 2010-09-23 ENCOUNTER — Encounter (HOSPITAL_COMMUNITY): Payer: Self-pay

## 2010-09-23 ENCOUNTER — Inpatient Hospital Stay (HOSPITAL_COMMUNITY)
Admission: RE | Admit: 2010-09-23 | Discharge: 2010-09-25 | DRG: 775 | Disposition: A | Discharge status: Home / Self Care | Payer: 59 | Source: Ambulatory Visit | Attending: Obstetrics and Gynecology | Admitting: Obstetrics and Gynecology

## 2010-09-23 DIAGNOSIS — Z3490 Encounter for supervision of normal pregnancy, unspecified, unspecified trimester: Secondary | ICD-10-CM

## 2010-09-23 HISTORY — DX: Tachycardia, unspecified: R00.0

## 2010-09-23 LAB — CBC
MCH: 23.5 pg — ABNORMAL LOW (ref 26.0–34.0)
Platelets: 251 10*3/uL (ref 150–400)
RBC: 4.05 MIL/uL (ref 3.87–5.11)
WBC: 12.7 10*3/uL — ABNORMAL HIGH (ref 4.0–10.5)

## 2010-09-23 LAB — RPR: RPR Ser Ql: NONREACTIVE

## 2010-09-23 MED ORDER — CITRIC ACID-SODIUM CITRATE 334-500 MG/5ML PO SOLN: 30.0000 mL | ORAL | Status: DC | PRN

## 2010-09-23 MED ORDER — IBUPROFEN 600 MG PO TABS
600.0000 mg | ORAL_TABLET | Freq: Four times a day (QID) | ORAL | Status: DC | PRN
  Filled 2010-09-23: qty 1

## 2010-09-23 MED ORDER — DIBUCAINE 1 % RE OINT: 1.0000 "application " | TOPICAL_OINTMENT | RECTAL | Status: DC | PRN

## 2010-09-23 MED ORDER — OXYTOCIN BOLUS FROM INFUSION
500.0000 mL | Freq: Once | INTRAVENOUS | Status: DC
  Filled 2010-09-23: qty 500

## 2010-09-23 MED ORDER — SIMETHICONE 80 MG PO CHEW: 80.0000 mg | CHEWABLE_TABLET | ORAL | Status: DC | PRN

## 2010-09-23 MED ORDER — PRENATAL PLUS 27-1 MG PO TABS
1.0000 | ORAL_TABLET | Freq: Every day | ORAL | Status: DC
  Administered 2010-09-24 – 2010-09-25 (×2): 1 via ORAL
  Filled 2010-09-23: qty 1

## 2010-09-23 MED ORDER — ACETAMINOPHEN 325 MG PO TABS: 650.0000 mg | ORAL_TABLET | ORAL | Status: DC | PRN

## 2010-09-23 MED ORDER — LACTATED RINGERS IV SOLN
500.0000 mL | INTRAVENOUS | Status: DC | PRN
  Administered 2010-09-23: 500 mL via INTRAVENOUS

## 2010-09-23 MED ORDER — DIPHENHYDRAMINE HCL 50 MG/ML IJ SOLN
12.5000 mg | INTRAMUSCULAR | Status: DC | PRN
Start: 2010-09-23 — End: 2010-09-24

## 2010-09-23 MED ORDER — SERTRALINE HCL 25 MG PO TABS
25.0000 mg | ORAL_TABLET | Freq: Every day | ORAL | Status: DC
  Filled 2010-09-23 (×2): qty 1

## 2010-09-23 MED ORDER — LIDOCAINE HCL (PF) 1 % IJ SOLN
30.0000 mL | INTRAMUSCULAR | Status: DC | PRN
  Filled 2010-09-23 (×2): qty 30

## 2010-09-23 MED ORDER — OXYTOCIN 20 UNITS IN LACTATED RINGERS INFUSION - SIMPLE: 125.0000 mL/h | INTRAVENOUS | Status: AC

## 2010-09-23 MED ORDER — OXYTOCIN 20 UNITS IN LACTATED RINGERS INFUSION - SIMPLE
1.0000 m[IU]/min | INTRAVENOUS | Status: DC
  Administered 2010-09-23: 2 m[IU]/min via INTRAVENOUS
  Administered 2010-09-23: 125 m[IU]/min via INTRAVENOUS
  Filled 2010-09-23: qty 1000

## 2010-09-23 MED ORDER — BENZOCAINE-MENTHOL 20-0.5 % EX AERO
INHALATION_SPRAY | CUTANEOUS | Status: AC
  Administered 2010-09-23: 1 via TOPICAL
  Filled 2010-09-23: qty 56

## 2010-09-23 MED ORDER — LACTATED RINGERS IV SOLN: INTRAVENOUS | Status: DC

## 2010-09-23 MED ORDER — PRENATAL PLUS 27-1 MG PO TABS: 1.0000 | ORAL_TABLET | Freq: Every day | ORAL | Status: DC

## 2010-09-23 MED ORDER — ONDANSETRON HCL 4 MG PO TABS: 4.0000 mg | ORAL_TABLET | ORAL | Status: DC | PRN

## 2010-09-23 MED ORDER — LABETALOL HCL 100 MG PO TABS
50.0000 mg | ORAL_TABLET | Freq: Once | ORAL | Status: AC
  Administered 2010-09-23: 11:00:00 via ORAL

## 2010-09-23 MED ORDER — PHENYLEPHRINE 40 MCG/ML (10ML) SYRINGE FOR IV PUSH (FOR BLOOD PRESSURE SUPPORT)
80.0000 ug | PREFILLED_SYRINGE | INTRAVENOUS | Status: DC | PRN
  Filled 2010-09-23: qty 5

## 2010-09-23 MED ORDER — TETANUS-DIPHTH-ACELL PERTUSSIS 5-2.5-18.5 LF-MCG/0.5 IM SUSP
0.5000 mL | Freq: Once | INTRAMUSCULAR | Status: AC
  Administered 2010-09-24: 0.5 mL via INTRAMUSCULAR

## 2010-09-23 MED ORDER — LABETALOL HCL 100 MG PO TABS: 50.0000 mg | ORAL_TABLET | Freq: Every day | ORAL | Status: DC

## 2010-09-23 MED ORDER — FENTANYL 2.5 MCG/ML BUPIVACAINE 1/10 % EPIDURAL INFUSION (WH - ANES)
14.0000 mL/h | INTRAMUSCULAR | Status: DC
  Administered 2010-09-23 (×2): 14 mL/h via EPIDURAL
  Filled 2010-09-23 (×3): qty 60

## 2010-09-23 MED ORDER — LANOLIN HYDROUS EX OINT: TOPICAL_OINTMENT | CUTANEOUS | Status: DC | PRN

## 2010-09-23 MED ORDER — EPHEDRINE 5 MG/ML INJ
10.0000 mg | INTRAVENOUS | Status: DC | PRN
  Filled 2010-09-23: qty 4

## 2010-09-23 MED ORDER — DIPHENHYDRAMINE HCL 25 MG PO CAPS: 25.0000 mg | ORAL_CAPSULE | Freq: Four times a day (QID) | ORAL | Status: DC | PRN

## 2010-09-23 MED ORDER — OXYCODONE-ACETAMINOPHEN 5-325 MG PO TABS
1.0000 | ORAL_TABLET | ORAL | Status: DC | PRN
  Administered 2010-09-24: 1 via ORAL
  Filled 2010-09-23: qty 1

## 2010-09-23 MED ORDER — OXYCODONE-ACETAMINOPHEN 5-325 MG PO TABS
2.0000 | ORAL_TABLET | ORAL | Status: DC | PRN
  Administered 2010-09-24: 1 via ORAL
  Filled 2010-09-23: qty 1

## 2010-09-23 MED ORDER — BUTORPHANOL TARTRATE 2 MG/ML IJ SOLN: 1.0000 mg | INTRAMUSCULAR | Status: DC | PRN

## 2010-09-23 MED ORDER — PHENYLEPHRINE 40 MCG/ML (10ML) SYRINGE FOR IV PUSH (FOR BLOOD PRESSURE SUPPORT)
80.0000 ug | PREFILLED_SYRINGE | INTRAVENOUS | Status: DC | PRN
  Filled 2010-09-23 (×2): qty 5

## 2010-09-23 MED ORDER — BENZOCAINE-MENTHOL 20-0.5 % EX AERO
1.0000 "application " | INHALATION_SPRAY | CUTANEOUS | Status: DC | PRN
  Administered 2010-09-23: 1 via TOPICAL

## 2010-09-23 MED ORDER — EPHEDRINE 5 MG/ML INJ
10.0000 mg | INTRAVENOUS | Status: DC | PRN
  Filled 2010-09-23 (×2): qty 4

## 2010-09-23 MED ORDER — ONDANSETRON HCL 4 MG/2ML IJ SOLN: 4.0000 mg | Freq: Four times a day (QID) | INTRAMUSCULAR | Status: DC | PRN

## 2010-09-23 MED ORDER — IBUPROFEN 600 MG PO TABS
600.0000 mg | ORAL_TABLET | Freq: Four times a day (QID) | ORAL | Status: DC
  Administered 2010-09-23 – 2010-09-25 (×8): 600 mg via ORAL
  Filled 2010-09-23 (×7): qty 1

## 2010-09-23 MED ORDER — TERBUTALINE SULFATE 1 MG/ML IJ SOLN: 0.2500 mg | Freq: Once | INTRAMUSCULAR | Status: AC | PRN

## 2010-09-23 MED ORDER — SENNOSIDES-DOCUSATE SODIUM 8.6-50 MG PO TABS
2.0000 | ORAL_TABLET | Freq: Every day | ORAL | Status: DC
  Administered 2010-09-23 – 2010-09-24 (×2): 2 via ORAL

## 2010-09-23 MED ORDER — OXYTOCIN 20 UNITS IN LACTATED RINGERS INFUSION - SIMPLE: 125.0000 mL/h | INTRAVENOUS | Status: DC | PRN

## 2010-09-23 MED ORDER — WITCH HAZEL-GLYCERIN EX PADS: 1.0000 "application " | MEDICATED_PAD | CUTANEOUS | Status: DC | PRN

## 2010-09-23 MED ORDER — ONDANSETRON HCL 4 MG/2ML IJ SOLN: 4.0000 mg | INTRAMUSCULAR | Status: DC | PRN

## 2010-09-23 MED ORDER — FLEET ENEMA 7-19 GM/118ML RE ENEM: 1.0000 | ENEMA | RECTAL | Status: DC | PRN

## 2010-09-23 MED ORDER — ZOLPIDEM TARTRATE 5 MG PO TABS: 5.0000 mg | ORAL_TABLET | Freq: Every evening | ORAL | Status: DC | PRN

## 2010-09-23 MED ORDER — LABETALOL HCL 100 MG PO TABS
50.0000 mg | ORAL_TABLET | Freq: Two times a day (BID) | ORAL | Status: DC
  Filled 2010-09-23: qty 1

## 2010-09-23 MED ORDER — LEVOTHYROXINE SODIUM 112 MCG PO TABS
112.0000 ug | ORAL_TABLET | Freq: Every day | ORAL | Status: DC
  Administered 2010-09-23: 112 ug via ORAL
  Filled 2010-09-23 (×2): qty 1

## 2010-09-23 MED ORDER — LACTATED RINGERS IV SOLN
INTRAVENOUS | Status: DC
  Administered 2010-09-23 (×2): via INTRAVENOUS

## 2010-09-23 MED ORDER — LACTATED RINGERS IV SOLN
500.0000 mL | Freq: Once | INTRAVENOUS | Status: AC
  Administered 2010-09-23: 1000 mL via INTRAVENOUS

## 2010-09-23 MED ORDER — LEVOTHYROXINE SODIUM 112 MCG PO TABS: 112.0000 ug | ORAL_TABLET | Freq: Two times a day (BID) | ORAL | Status: DC

## 2010-09-23 NOTE — Progress Notes (Signed)
[name] is a 30 y.o. G2P1001 at [redacted]w[redacted]d admitted for induction of labor due to Elective at term.  Subjective: comf with epidural  Objective: BP 105/68  Pulse 84  Temp(Src) 98 F (36.7 C) (Oral)  Resp 20  Ht 5' (1.524 m)  Wt 69.854 kg (154 lb)  BMI 30.08 kg/m2  SpO2 99%     gen NAD FHT:  FHR: 135-140 bpm, variability: moderate,  accelerations:  Present,  decelerations:  Absent UC:   regular, every 3-4 minutes SVE:   Dilation: 4 Effacement (%): 90 Station: -1 Exam by:: Carmelina Noun, MD  Labs: Lab Results  Component Value Date   WBC 12.7* 09/23/2010   HGB 9.5* 09/23/2010   HCT 29.9* 09/23/2010   MCV 73.8* 09/23/2010   PLT 251 09/23/2010    Assessment / Plan: Induction of labor due to term with favorable cervix,  progressing well on pitocin  Labor: Progressing normally Preeclampsia:  no signs or symptoms of toxicity Fetal Wellbeing:  Category I Pain Control:  Epidural  Anticipated MOD:  NSVD  BOVARD,JODY 09/23/2010, 12:11 PM

## 2010-09-23 NOTE — Anesthesia Procedure Notes (Signed)
Epidural Patient location during procedure: OB Start time: 09/23/2010 9:06 AM  Staffing Anesthesiologist: Jiles Garter  Preanesthetic Checklist Completed: patient identified, site marked, surgical consent, pre-op evaluation, timeout performed, IV checked, risks and benefits discussed and monitors and equipment checked  Epidural Patient position: sitting Prep: site prepped and draped and DuraPrep Patient monitoring: continuous pulse ox and blood pressure Approach: midline Injection technique: LOR air  Needle:  Needle type: Tuohy  Needle gauge: 17 G Needle length: 9 cm Needle insertion depth: 5 cm cm Catheter type: closed end flexible Catheter size: 19 Gauge Catheter at skin depth: 10 cm Test dose: negative  Assessment Events: blood aspirated, injection not painful, no injection resistance, negative IV test and no paresthesia  Additional Notes Dosing of Epidural: 1st dose, Through needle...... 5mg  Marcaine 2nd dose, through catheter.... epi 1:200K + Xylocaine 40 mg 3rd dose, through catheter...Marland KitchenMarland Kitchenepi 1:200K + Xylocaine 40 mg Each dose occurred after waiting 3 min,patient was free of IV sx; and patient exhibits no evidence of SA injection  Patient is more comfortable after epidural dosed. Please see RN's note for documentation of vital signs,and FHR which are stable.

## 2010-09-23 NOTE — Anesthesia Preprocedure Evaluation (Signed)
Anesthesia Evaluation  Name, MR# and DOB Patient awake  General Assessment Comment  Reviewed: Allergy & Precautions, H&P , Patient's Chart, lab work & pertinent test results  Airway Mallampati: II TM Distance: >3 FB Neck ROM: full    Dental  (+) Teeth Intact   Pulmonary  clear to auscultation  breath sounds clear to auscultation none    Cardiovascular + dysrhythmias (on labatolol; controlled) regular Normal    Neuro/Psych   GI/Hepatic/Renal   Endo/Other    Abdominal   Musculoskeletal   Hematology   Peds  Reproductive/Obstetrics (+) Pregnancy    Anesthesia Other Findings                 Anesthesia Physical Anesthesia Plan  ASA: II  Anesthesia Plan: Epidural   Post-op Pain Management:    Induction:   Airway Management Planned:   Additional Equipment:   Intra-op Plan:   Post-operative Plan:   Informed Consent: I have reviewed the patients History and Physical, chart, labs and discussed the procedure including the risks, benefits and alternatives for the proposed anesthesia with the patient or authorized representative who has indicated his/her understanding and acceptance.   Dental Advisory Given  Plan Discussed with: CRNA and Surgeon  Anesthesia Plan Comments: (Labs checked- platelets confirmed with RN in room. Fetal heart tracing, per RN, reportedly stable enough for sitting procedure. Discussed epidural, and patient consents to the procedure:  included risk of possible headache,backache, failed block, allergic reaction, and nerve injury. This patient was asked if she had any questions or concerns before the procedure started. )        Anesthesia Quick Evaluation

## 2010-09-23 NOTE — Progress Notes (Signed)
[name] is a 30 y.o. G2P1001 at [redacted]w[redacted]d  admitted for induction of labor due to Elective at term.  Subjective: uncomf with ctx  Objective: BP 115/69  Pulse 113  Temp(Src) 98.5 F (36.9 C) (Oral)  Resp 22  Ht 5' (1.524 m)  Wt 69.854 kg (154 lb)  BMI 30.08 kg/m2     gen NAD FHT:  FHR: 130's bpm, variability: moderate,  accelerations:  Present,  decelerations:  Absent UC:   irregular SVE:  2/70/-2, vtx   AROM sm fluid  Labs: Lab Results  Component Value Date   WBC 13.6* 07/16/2010   HGB 10.0* 07/16/2010   HCT 29.6* 07/16/2010   MCV 82.7 07/16/2010   PLT 261 07/16/2010    Assessment / Plan: Induction of labor due to term with favorable cervix,  progressing well on pitocin  Labor: progressing AROM will start pitocin Preeclampsia:  no signs or symptoms of toxicity Fetal Wellbeing:  Category I Pain Control:  IV pain meds/ Epidural prn  Anticipated MOD:  NSVD  BOVARD,JODY 09/23/2010, 8:28 AM

## 2010-09-23 NOTE — H&P (Signed)
H&P in Chart review, Notes, Orders only! JBOVARD

## 2010-09-24 LAB — CBC
HCT: 24.6 % — ABNORMAL LOW (ref 36.0–46.0)
Hemoglobin: 7.8 g/dL — ABNORMAL LOW (ref 12.0–15.0)
MCH: 23.3 pg — ABNORMAL LOW (ref 26.0–34.0)
MCHC: 31.7 g/dL (ref 30.0–36.0)

## 2010-09-24 MED ORDER — LABETALOL HCL 100 MG PO TABS
50.0000 mg | ORAL_TABLET | Freq: Every day | ORAL | Status: DC
  Administered 2010-09-24: 50 mg via ORAL
  Filled 2010-09-24 (×2): qty 1

## 2010-09-24 MED ORDER — SERTRALINE HCL 50 MG PO TABS
50.0000 mg | ORAL_TABLET | Freq: Every day | ORAL | Status: DC
  Administered 2010-09-24 – 2010-09-25 (×2): 50 mg via ORAL
  Filled 2010-09-24 (×2): qty 1

## 2010-09-24 MED ORDER — LEVOTHYROXINE SODIUM 112 MCG PO TABS
112.0000 ug | ORAL_TABLET | Freq: Two times a day (BID) | ORAL | Status: DC
  Administered 2010-09-24 – 2010-09-25 (×3): 112 ug via ORAL
  Filled 2010-09-24 (×3): qty 1

## 2010-09-24 NOTE — Progress Notes (Addendum)
Post Partum Day 1 Subjective: no complaints, tolerating PO and nl lochia, pain controlled  Objective: Blood pressure 117/73, pulse 95, temperature 97.9 F (36.6 C), temperature source Oral, resp. rate 18, height 5' (1.524 m), weight 69.854 kg (154 lb), SpO2 98.00%, unknown if currently breastfeeding.  Physical Exam:  General: alert and no distress Lochia: appropriate Uterine Fundus: firm  DVT Evaluation: No evidence of DVT seen on physical exam.   Basename 09/24/10 0556 09/23/10 0800  HGB 7.8* 9.5*  HCT 24.6* 29.9*    Assessment/Plan: Plan for discharge tomorrow doing well, routine care.  Circumcision done w/o diff/comp   LOS: 1 day   BOVARD,JODY 09/24/2010, 8:02 AM

## 2010-09-24 NOTE — Anesthesia Postprocedure Evaluation (Signed)
Anesthesia Post Note  Patient: [name]  Procedure(s) Performed: * No procedures listed *  Anesthesia type: Epidural  Patient location: Mother/Baby  Post pain: Pain level controlled  Post assessment: Post-op Vital signs reviewed, Patient's Cardiovascular Status Stable, Respiratory Function Stable, Patent Airway and No signs of Nausea or vomiting  Last Vitals:  Filed Vitals:   09/24/10 0005  BP: 117/73  Pulse: 95  Temp: 97.9 F (36.6 C)  Resp: 18    Post vital signs: Reviewed and stable  Level of consciousness: awake, alert , oriented, patient cooperative and responds to stimulation  Complications: No apparent anesthesia complications2

## 2010-09-25 MED ORDER — IBUPROFEN 600 MG PO TABS: 600.0000 mg | ORAL_TABLET | Freq: Four times a day (QID) | ORAL | Status: AC

## 2010-09-25 MED ORDER — OXYCODONE-ACETAMINOPHEN 5-325 MG PO TABS: 1.0000 | ORAL_TABLET | ORAL | Status: AC | PRN

## 2010-09-25 NOTE — Discharge Summary (Signed)
Obstetric Discharge Summary Reason for Admission: induction of labor Prenatal Procedures: none Intrapartum Procedures: spontaneous vaginal delivery Postpartum Procedures: none Complications-Operative and Postpartum: 2nd degree perineal laceration Hemoglobin  Date Value Range Status  09/24/2010 7.8* 12.0-15.0 (g/dL) Final     DELTA CHECK NOTED     REPEATED TO VERIFY     HCT  Date Value Range Status  09/24/2010 24.6* 36.0-46.0 (%) Final    Discharge Diagnoses: Term Pregnancy-delivered                                         S/p NSVD  Discharge Information: Date: 09/25/2010 Activity: pelvic rest Diet: routine Medications: Ibuprophen, Percocet and zoloft, synthroid, labetolol Condition: improved Instructions: refer to practice specific booklet Discharge to: home Follow-up Information    Follow up with BOVARD,JODY, MD in 6 weeks.   Contact information:   [address] [place] [address] [phone]          Newborn Data: Live born female  Birth Weight: 6 lb 12 oz (3062 g) APGAR: 8, 9  Home with mother.  Oliver Pila 09/25/2010, 10:18 AM

## 2010-09-25 NOTE — Progress Notes (Signed)
Post Partum Day 2 Subjective: no complaints, has not noted any palpitations since delivery  Objective: Blood pressure 116/71, pulse 68, temperature 98.2 F (36.8 C), temperature source Oral, resp. rate 20, height 5' (1.524 m), weight 69.854 kg (154 lb), SpO2 98.00%, unknown if currently breastfeeding.  Physical Exam:  General: alert Lochia: appropriate Uterine Fundus: firm I  Basename 09/24/10 0556 09/23/10 0800  HGB 7.8* 9.5*  HCT 24.6* 29.9*    Assessment/Plan: Discharge home Motrin, Percocet and Labetolol   LOS: 2 days   Shavon Zenz W 09/25/2010, 10:14 AM

## 2010-09-28 ENCOUNTER — Encounter (HOSPITAL_COMMUNITY): Payer: Self-pay

## 2010-10-26 LAB — CBC
HCT: 30.6 — ABNORMAL LOW
Hemoglobin: 10.3 — ABNORMAL LOW
Hemoglobin: 13.5
MCV: 92.5
Platelets: 251
RBC: 3.31 — ABNORMAL LOW
RBC: 4.3
WBC: 11.4 — ABNORMAL HIGH
WBC: 18.8 — ABNORMAL HIGH

## 2010-10-26 LAB — RPR: RPR Ser Ql: NONREACTIVE

## 2012-01-11 ENCOUNTER — Encounter: Payer: Self-pay | Admitting: Family

## 2012-01-11 ENCOUNTER — Ambulatory Visit (INDEPENDENT_AMBULATORY_CARE_PROVIDER_SITE_OTHER): Payer: BC Managed Care – PPO | Admitting: Family

## 2012-01-11 VITALS — BP 108/60 | HR 100 | Temp 99.0°F | Wt 133.0 lb

## 2012-01-11 DIAGNOSIS — J020 Streptococcal pharyngitis: Secondary | ICD-10-CM

## 2012-01-11 DIAGNOSIS — R51 Headache: Secondary | ICD-10-CM

## 2012-01-11 MED ORDER — AMOXICILLIN 500 MG PO TABS: 1000.0000 mg | ORAL_TABLET | Freq: Two times a day (BID) | ORAL | Status: DC

## 2012-01-11 NOTE — Patient Instructions (Signed)

## 2012-01-11 NOTE — Progress Notes (Signed)
Subjective:    Patient ID: [name], female    DOB: 11-Aug-1980, 31 y.o.   MRN: [id number]  HPI 31 year old white female, nonsmoker, patient of Dr. Lovell Sheehan is in today with complaints of sore throat x4 days. Patient has had difficulty swallowing due to to the severity of her sore throat. Has developed a low-grade fever around 99 as well as headache and muscle aches. Hasn't taken ibuprofen that helps myalgias.   Review of Systems  Constitutional: Positive for fever and fatigue.  HENT: Positive for sore throat.   Respiratory: Negative.   Cardiovascular: Negative.   Gastrointestinal: Negative.   Neurological: Positive for headaches. Negative for dizziness and light-headedness.  Hematological: Negative.   Psychiatric/Behavioral: Negative.    Past Medical History  Diagnosis Date  . Cancer   . S/P thyroidectomy 2005    Hx of thyroid cancer  . Dysrhythmia     seeing cardiologist for tachycardia on meds  . Hypothyroidism     had thyroid cancer  . Tachycardia   . SVD (spontaneous vaginal delivery) 09/23/2010    History   Social History  . Marital Status: Married    Spouse Name: N/A    Number of Children: N/A  . Years of Education: N/A   Occupational History  . Not on file.   Social History Main Topics  . Smoking status: Never Smoker   . Smokeless tobacco: Not on file  . Alcohol Use: No  . Drug Use: No  . Sexually Active: Yes    Birth Control/ Protection: IUD   Other Topics Concern  . Not on file   Social History Narrative  . No narrative on file    Past Surgical History  Procedure Date  . Thyroidectomy 2004  . Mouth surgery     Family History  Problem Relation Age of Onset  . Diabetes Father   . Cancer Maternal Aunt     leaukemia  . Miscarriages / Stillbirths Maternal Aunt   . Hypertension Maternal Grandmother   . Asthma Maternal Grandfather   . Diabetes Maternal Grandfather     Allergies  Allergen Reactions  . Azithromycin Other (See  Comments)    Stomach cramps    Current Outpatient Prescriptions on File Prior to Visit  Medication Sig Dispense Refill  . levothyroxine (SYNTHROID, LEVOTHROID) 112 MCG tablet Take 112 mcg by mouth 2 (two) times daily.        . prenatal vitamin w/FE, FA (PRENATAL 1 + 1) 27-1 MG TABS Take 1 tablet by mouth daily.        . sertraline (ZOLOFT) 50 MG tablet Take 25 mg by mouth daily.       Marland Kitchen labetalol (NORMODYNE) 100 MG tablet Take 50 mg by mouth daily.          BP 108/60  Pulse 100  Temp 99 F (37.2 C) (Oral)  Wt 133 lb (60.328 kg)  SpO2 98%chart    Objective:   Physical Exam  Constitutional: She is oriented to person, place, and time. She appears well-developed and well-nourished.  HENT:  Right Ear: External ear normal.  Left Ear: External ear normal.  Nose: Nose normal.       Parents moderately red. White exudate noted bilaterally. 2+ tonsils.  Neck: Normal range of motion. Neck supple.  Cardiovascular: Normal rate and regular rhythm.   Pulmonary/Chest: Effort normal and breath sounds normal.  Lymphadenopathy:    She has cervical adenopathy.  Neurological: She is alert and oriented to person, place, and  time.  Skin: Skin is warm and dry.  Psychiatric: She has a normal mood and affect.    Rapid strep: Positive      Assessment & Plan:  Assessment: Strep pharyngitis, headache  Plan: Amoxicillin 500 mg 2 capsules by mouth twice a day x10 days. Over-the-counter Tylenol and ibuprofen as needed for fever aches and pains. Rest. Drink plenty of fluids. Patient call the office if symptoms worsen or persist. Recheck a schedule, and when necessary.

## 2012-10-17 ENCOUNTER — Ambulatory Visit (INDEPENDENT_AMBULATORY_CARE_PROVIDER_SITE_OTHER): Payer: BC Managed Care – PPO | Admitting: Nurse Practitioner

## 2012-10-17 ENCOUNTER — Encounter: Payer: Self-pay | Admitting: Nurse Practitioner

## 2012-10-17 VITALS — BP 90/50 | HR 60 | Temp 98.9°F | Ht 61.0 in | Wt 138.8 lb

## 2012-10-17 DIAGNOSIS — E039 Hypothyroidism, unspecified: Secondary | ICD-10-CM | POA: Insufficient documentation

## 2012-10-17 DIAGNOSIS — F411 Generalized anxiety disorder: Secondary | ICD-10-CM | POA: Insufficient documentation

## 2012-10-17 DIAGNOSIS — R000 Tachycardia, unspecified: Secondary | ICD-10-CM

## 2012-10-17 DIAGNOSIS — J020 Streptococcal pharyngitis: Secondary | ICD-10-CM

## 2012-10-17 MED ORDER — PENICILLIN G BENZATHINE 1200000 UNIT/2ML IM SUSP
600000.0000 [IU] | Freq: Once | INTRAMUSCULAR | Status: AC
  Administered 2012-10-17: 600000 [IU] via INTRAMUSCULAR

## 2012-10-17 NOTE — Progress Notes (Signed)
  Subjective:    Patient ID: [name], female    DOB: 02-Jan-1981, 32 y.o.   MRN: [id number]  Sore Throat  This is a new problem. The current episode started yesterday. The problem has been gradually worsening. Neither side of throat is experiencing more pain than the other. The maximum temperature recorded prior to her arrival was 100 - 100.9 F. The fever has been present for less than 1 day. The pain is severe. Associated symptoms include headaches and swollen glands. Pertinent negatives include no abdominal pain, congestion, coughing, diarrhea, ear pain, hoarse voice, neck pain, shortness of breath or vomiting. She has tried NSAIDs for the symptoms. The treatment provided mild relief.      Review of Systems  Constitutional: Positive for fever and fatigue. Negative for chills, activity change and appetite change.  HENT: Positive for sore throat. Negative for ear pain, congestion, hoarse voice and neck pain.   Respiratory: Negative for cough and shortness of breath.   Cardiovascular: Negative for chest pain, palpitations and leg swelling.       Hx tachycardia while pregnant  Gastrointestinal: Negative for nausea, vomiting, abdominal pain and diarrhea.  Skin: Negative for rash.  Neurological: Positive for headaches.  Hematological: Negative for adenopathy.       Objective:   Physical Exam  Vitals reviewed. Constitutional: She appears well-developed and well-nourished. No distress.  HENT:  Head: Normocephalic and atraumatic.  Right Ear: External ear normal.  Left Ear: External ear normal.  Mouth/Throat: Oropharyngeal exudate present.  Eyes: Conjunctivae are normal. Right eye exhibits no discharge. Left eye exhibits no discharge.  Neck: Normal range of motion. Neck supple. No tracheal deviation present. No thyromegaly present.  Cardiovascular: Regular rhythm.   No murmur heard. Tachycardia p 112. Pt gives Hx of tachycardia while pregnant. Likely die to fever today.   Pulmonary/Chest: Effort normal and breath sounds normal. No respiratory distress. She has no wheezes.  Musculoskeletal:  Normal gait  Lymphadenopathy:    She has cervical adenopathy.  Neurological: She is alert.  Skin: Skin is warm and dry.  Psychiatric: She has a normal mood and affect. Her behavior is normal. Thought content normal.          Assessment & Plan:  1. Streptococcal sore throat  - POCT rapid strep A- positive - penicillin g benzathine (BICILLIN LA) 1200000 UNIT/2ML injection 600,000 Units; Inject 1 mL (600,000 Units total) into the muscle once.  See pt instructions.  2 tachycardia, likely due to fever, but pt has hx of tachycardia while pregnant. Took labetolol. Pt will check pulse at home, instructions given. Will call Dr Lovell Sheehan for eval if tachycardic when no fever.

## 2012-10-17 NOTE — Patient Instructions (Addendum)
You have a bacterial strep infection. The injection we gave you today should clear it up. Start salt water gargles several times daily (1/4 tsp salt mixed w/1/4 cup warm water). Listerene gargles several times daily. You may use benzocaine throat lozenges for comfort. Continue to use ytlenol or ibuprophen for fever as needed. If you are not feeling better by next week, please call for re-evaluation.  Check pulse rate for 1 full minute. If over 100, please contact Dr. Lovell Sheehan. Also, you are due for a physical. Please schedule.  Strep Throat Strep throat is an infection of the throat caused by a bacteria named Streptococcus pyogenes. Your caregiver may call the infection streptococcal "tonsillitis" or "pharyngitis" depending on whether there are signs of inflammation in the tonsils or back of the throat. Strep throat is most common in children aged 5 15 years during the cold months of the year, but it can occur in people of any age during any season. This infection is spread from person to person (contagious) through coughing, sneezing, or other close contact. SYMPTOMS   Fever or chills.  Painful, swollen, red tonsils or throat.  Pain or difficulty when swallowing.  White or yellow spots on the tonsils or throat.  Swollen, tender lymph nodes or "glands" of the neck or under the jaw.  Red rash all over the body (rare). DIAGNOSIS  Many different infections can cause the same symptoms. A test must be done to confirm the diagnosis so the right treatment can be given. A "rapid strep test" can help your caregiver make the diagnosis in a few minutes. If this test is not available, a light swab of the infected area can be used for a throat culture test. If a throat culture test is done, results are usually available in a day or two. TREATMENT  Strep throat is treated with antibiotic medicine. HOME CARE INSTRUCTIONS   Gargle with 1 tsp of salt in 1 cup of warm water, 3 4 times per day or as needed for  comfort.  Family members who also have a sore throat or fever should be tested for strep throat and treated with antibiotics if they have the strep infection.  Make sure everyone in your household washes their hands well.  Do not share food, drinking cups, or personal items that could cause the infection to spread to others.  You may need to eat a soft food diet until your sore throat gets better.  Drink enough water and fluids to keep your urine clear or pale yellow. This will help prevent dehydration.  Get plenty of rest.  Stay home from school, daycare, or work until you have been on antibiotics for 24 hours.  Only take over-the-counter or prescription medicines for pain, discomfort, or fever as directed by your caregiver.  If antibiotics are prescribed, take them as directed. Finish them even if you start to feel better. SEEK MEDICAL CARE IF:   The glands in your neck continue to enlarge.  You develop a rash, cough, or earache.  You cough up green, yellow-brown, or bloody sputum.  You have pain or discomfort not controlled by medicines.  Your problems seem to be getting worse rather than better. SEEK IMMEDIATE MEDICAL CARE IF:   You develop any new symptoms such as vomiting, severe headache, stiff or painful neck, chest pain, shortness of breath, or trouble swallowing.  You develop severe throat pain, drooling, or changes in your voice.  You develop swelling of the neck, or the skin on  the neck becomes red and tender.  You have a fever.  You develop signs of dehydration, such as fatigue, dry mouth, and decreased urination.  You become increasingly sleepy, or you cannot wake up completely. Document Released: 01/08/2000 Document Revised: 12/28/2011 Document Reviewed: 03/11/2010 [facility] Patient Information 2014 [place], [place].

## 2012-10-31 ENCOUNTER — Encounter: Payer: BC Managed Care – PPO | Admitting: Nurse Practitioner

## 2012-11-07 ENCOUNTER — Other Ambulatory Visit: Payer: Self-pay | Admitting: Nurse Practitioner

## 2012-11-07 ENCOUNTER — Encounter: Payer: Self-pay | Admitting: Nurse Practitioner

## 2012-11-07 ENCOUNTER — Ambulatory Visit (INDEPENDENT_AMBULATORY_CARE_PROVIDER_SITE_OTHER): Payer: BC Managed Care – PPO | Admitting: Nurse Practitioner

## 2012-11-07 VITALS — BP 100/48 | HR 102 | Temp 98.8°F | Ht 61.0 in | Wt 136.5 lb

## 2012-11-07 DIAGNOSIS — Z0000 Encounter for general adult medical examination without abnormal findings: Secondary | ICD-10-CM

## 2012-11-07 DIAGNOSIS — Z23 Encounter for immunization: Secondary | ICD-10-CM

## 2012-11-07 DIAGNOSIS — J029 Acute pharyngitis, unspecified: Secondary | ICD-10-CM

## 2012-11-07 LAB — CBC
HCT: 42.1 % (ref 36.0–46.0)
MCV: 85.4 fl (ref 78.0–100.0)
Platelets: 301 10*3/uL (ref 150.0–400.0)
RBC: 4.93 Mil/uL (ref 3.87–5.11)
WBC: 7.3 10*3/uL (ref 4.5–10.5)

## 2012-11-07 LAB — LIPID PANEL
Cholesterol: 280 mg/dL — ABNORMAL HIGH (ref 0–200)
Triglycerides: 68 mg/dL (ref 0.0–149.0)

## 2012-11-07 LAB — RENAL FUNCTION PANEL
Albumin: 3.4 g/dL — ABNORMAL LOW (ref 3.5–5.2)
Calcium: 9.2 mg/dL (ref 8.4–10.5)
Creatinine, Ser: 0.8 mg/dL (ref 0.4–1.2)
Glucose, Bld: 86 mg/dL (ref 70–99)
Phosphorus: 3.7 mg/dL (ref 2.3–4.6)
Potassium: 4 mEq/L (ref 3.5–5.1)
Sodium: 139 mEq/L (ref 135–145)

## 2012-11-07 LAB — HEPATIC FUNCTION PANEL
ALT: 15 U/L (ref 0–35)
AST: 18 U/L (ref 0–37)
Total Bilirubin: 1.1 mg/dL (ref 0.3–1.2)
Total Protein: 7.2 g/dL (ref 6.0–8.3)

## 2012-11-07 LAB — T4, FREE: Free T4: 0.67 ng/dL (ref 0.60–1.60)

## 2012-11-07 NOTE — Progress Notes (Signed)
Subjective:     [name] is a 32 y.o. female and is here for a comprehensive physical exam. The patient reports problems - sore throat since this am..  History   Social History  . Marital Status: Married    Spouse Name: william    Number of Children: 2  . Years of Education: N/A   Occupational History  .  Other    stay-at-home mom   Social History Main Topics  . Smoking status: Never Smoker   . Smokeless tobacco: Not on file  . Alcohol Use: No     Comment: occasional  . Drug Use: No  . Sexual Activity: Yes    Birth Control/ Protection: IUD   Other Topics Concern  . Not on file   Social History Narrative  . No narrative on file   Health Maintenance  Topic Date Due  . Influenza Vaccine  11/16/2012  . Pap Smear  11/08/2014  . Tetanus/tdap  09/23/2020    The following portions of the patient's history were reviewed and updated as appropriate: allergies, current medications, past family history, past medical history, past social history, past surgical history and problem list.  Review of Systems Constitutional: positive for weight gain Eyes: positive for contacts/glasses Ears, nose, mouth, throat, and face: positive for sore throat Respiratory: negative for cough Cardiovascular: negative for chest pain, chest pressure/discomfort, dyspnea, exertional chest pressure/discomfort, fatigue, irregular heart beat, lower extremity edema and near-syncope Gastrointestinal: negative for abdominal pain, change in bowel habits, dyspepsia and nausea, occasional diarrhea immediately after eating, not sure what foods trigger Genitourinary:negative, has mirena Integument/breast: negative for rash Hematologic/lymphatic: negative Musculoskeletal:negative Neurological: negative Behavioral/Psych: negative Endocrine: negative, taking supplemental thyroid hormone post thyroidectomy for thyr ca. Feels like harder & harder to keep weight off.   Objective:    BP 100/48  Pulse 102   Temp(Src) 98.8 F (37.1 C) (Oral)  Ht 5\' 1"  (1.549 m)  Wt 136 lb 8 oz (61.916 kg)  BMI 25.8 kg/m2  SpO2 96% General appearance: alert, cooperative, appears stated age and no distress Head: Normocephalic, without obvious abnormality, atraumatic Eyes: negative findings: lids and lashes normal, conjunctivae and sclerae normal, corneas clear and pupils equal, round, reactive to light and accomodation Ears: normal TM's and external ear canals both ears Throat: lips, mucosa, and tongue normal; teeth and gums normal erythematous posterior pharynx. Back: symmetric, no curvature. ROM normal. No CVA tenderness. Lungs: clear to auscultation bilaterally Heart: regular rate and rhythm, S1, S2 normal, no murmur, click, rub or gallop Abdomen: soft, non-tender; bowel sounds normal; no masses,  no organomegaly Extremities: extremities normal, atraumatic, no cyanosis or edema Pulses: 2+ and symmetric Skin: Skin color, texture, turgor normal. No rashes or lesions Lymph nodes: Cervical, supraclavicular, and axillary nodes normal. Neurologic: Alert and oriented X 3, normal strength and tone. Normal symmetric reflexes. Normal coordination and gait    Assessment:    Healthy female exam. Needs flu, up to date on tetanus, had pap 1 yr ago   Erythematous posterior pharynx, c/o sore throat since this am, Rapid strep neg ,  Weight gain, Dr Talmage Nap following thyroid mngmt post thyroidectomy.     Plan:    Adm flu today, screening labs as ordered. Will cont to see gynecology. Upper resp cx sent Adv to cut out sugar, increase activity to 30 minutes 5d/w, consume 1500-1700 cals/d to lose 2 lb/wk up to 5 lb for healthy weight. See After Visit Summary for Counseling Recommendations

## 2012-11-07 NOTE — Patient Instructions (Signed)
Think about your health long-term: exercise & healthy diet now can deter chronic illness in 20 years like diabtetes, heart disease, and some cancers. Exercise goal is 30 minutes 5 days of week. Diet goal: 5 servings fruit & veggies daily, lean meats, limit sugar-no sugar is best. For weight loss: aim for 1500 -1700 cals daily to lose about 2 pounds weekly. Losing 5 pounds will put you in a healthy weight category which will lower your risk for chronic illness. Our office will call with lab results. Great to see you!  Preventive Care for Adults, Female A healthy lifestyle and preventive care can promote health and wellness. Preventive health guidelines for women include the following key practices.  A routine yearly physical is a good way to check with your caregiver about your health and preventive screening. It is a chance to share any concerns and updates on your health, and to receive a thorough exam.  Visit your dentist for a routine exam and preventive care every 6 months. Brush your teeth twice a day and floss once a day. Good oral hygiene prevents tooth decay and gum disease.  The frequency of eye exams is based on your age, health, family medical history, use of contact lenses, and other factors. Follow your caregiver's recommendations for frequency of eye exams.  Eat a healthy diet. Foods like vegetables, fruits, whole grains, low-fat dairy products, and lean protein foods contain the nutrients you need without too many calories. Decrease your intake of foods high in solid fats, added sugars, and salt. Eat the right amount of calories for you.Get information about a proper diet from your caregiver, if necessary.  Regular physical exercise is one of the most important things you can do for your health. Most adults should get at least 150 minutes of moderate-intensity exercise (any activity that increases your heart rate and causes you to sweat) each week. In addition, most adults need  muscle-strengthening exercises on 2 or more days a week.  Maintain a healthy weight. The body mass index (BMI) is a screening tool to identify possible weight problems. It provides an estimate of body fat based on height and weight. Your caregiver can help determine your BMI, and can help you achieve or maintain a healthy weight.For adults 20 years and older:  A BMI below 18.5 is considered underweight.  A BMI of 18.5 to 24.9 is normal.  A BMI of 25 to 29.9 is considered overweight.  A BMI of 30 and above is considered obese.  Maintain normal blood lipids and cholesterol levels by exercising and minimizing your intake of saturated fat. Eat a balanced diet with plenty of fruit and vegetables. Blood tests for lipids and cholesterol should begin at age 63 and be repeated every 5 years. If your lipid or cholesterol levels are high, you are over 50, or you are at high risk for heart disease, you may need your cholesterol levels checked more frequently.Ongoing high lipid and cholesterol levels should be treated with medicines if diet and exercise are not effective.  If you smoke, find out from your caregiver how to quit. If you do not use tobacco, do not start.  If you are pregnant, do not drink alcohol. If you are breastfeeding, be very cautious about drinking alcohol. If you are not pregnant and choose to drink alcohol, do not exceed 1 drink per day. One drink is considered to be 12 ounces (355 mL) of beer, 5 ounces (148 mL) of wine, or 1.5 ounces (44 mL)  of liquor.  Avoid use of street drugs. Do not share needles with anyone. Ask for help if you need support or instructions about stopping the use of drugs.  High blood pressure causes heart disease and increases the risk of stroke. Your blood pressure should be checked at least every 1 to 2 years. Ongoing high blood pressure should be treated with medicines if weight loss and exercise are not effective.  If you are 69 to 30 years old, ask your  caregiver if you should take aspirin to prevent strokes.  Diabetes screening involves taking a blood sample to check your fasting blood sugar level. This should be done once every 3 years, after age 54, if you are within normal weight and without risk factors for diabetes. Testing should be considered at a younger age or be carried out more frequently if you are overweight and have at least 1 risk factor for diabetes.  Breast cancer screening is essential preventive care for women. You should practice "breast self-awareness." This means understanding the normal appearance and feel of your breasts and may include breast self-examination. Any changes detected, no matter how small, should be reported to a caregiver. Women in their 70s and 30s should have a clinical breast exam (CBE) by a caregiver as part of a regular health exam every 1 to 3 years. After age 59, women should have a CBE every year. Starting at age 69, women should consider having a mammography (breast X-ray test) every year. Women who have a family history of breast cancer should talk to their caregiver about genetic screening. Women at a high risk of breast cancer should talk to their caregivers about having magnetic resonance imaging (MRI) and a mammography every year.  The Pap test is a screening test for cervical cancer. A Pap test can show cell changes on the cervix that might become cervical cancer if left untreated. A Pap test is a procedure in which cells are obtained and examined from the lower end of the uterus (cervix).  Women should have a Pap test starting at age 61.  Between ages 59 and 14, Pap tests should be repeated every 2 years.  Beginning at age 76, you should have a Pap test every 3 years as long as the past 3 Pap tests have been normal.  Some women have medical problems that increase the chance of getting cervical cancer. Talk to your caregiver about these problems. It is especially important to talk to your  caregiver if a new problem develops soon after your last Pap test. In these cases, your caregiver may recommend more frequent screening and Pap tests.  The above recommendations are the same for women who have or have not gotten the vaccine for human papillomavirus (HPV).  If you had a hysterectomy for a problem that was not cancer or a condition that could lead to cancer, then you no longer need Pap tests. Even if you no longer need a Pap test, a regular exam is a good idea to make sure no other problems are starting.  If you are between ages 25 and 60, and you have had normal Pap tests going back 10 years, you no longer need Pap tests. Even if you no longer need a Pap test, a regular exam is a good idea to make sure no other problems are starting.  If you have had past treatment for cervical cancer or a condition that could lead to cancer, you need Pap tests and screening for cancer  for at least 20 years after your treatment.  If Pap tests have been discontinued, risk factors (such as a new sexual partner) need to be reassessed to determine if screening should be resumed.  The HPV test is an additional test that may be used for cervical cancer screening. The HPV test looks for the virus that can cause the cell changes on the cervix. The cells collected during the Pap test can be tested for HPV. The HPV test could be used to screen women aged 62 years and older, and should be used in women of any age who have unclear Pap test results. After the age of 64, women should have HPV testing at the same frequency as a Pap test.  Colorectal cancer can be detected and often prevented. Most routine colorectal cancer screening begins at the age of 64 and continues through age 78. However, your caregiver may recommend screening at an earlier age if you have risk factors for colon cancer. On a yearly basis, your caregiver may provide home test kits to check for hidden blood in the stool. Use of a small camera at  the end of a tube, to directly examine the colon (sigmoidoscopy or colonoscopy), can detect the earliest forms of colorectal cancer. Talk to your caregiver about this at age 55, when routine screening begins. Direct examination of the colon should be repeated every 5 to 10 years through age 65, unless early forms of pre-cancerous polyps or small growths are found.  Hepatitis C blood testing is recommended for all people born from [birth year] through 1965 and any individual with known risks for hepatitis C.  Practice safe sex. Use condoms and avoid high-risk sexual practices to reduce the spread of sexually transmitted infections (STIs). STIs include gonorrhea, chlamydia, syphilis, trichomonas, herpes, HPV, and human immunodeficiency virus (HIV). Herpes, HIV, and HPV are viral illnesses that have no cure. They can result in disability, cancer, and death. Sexually active women aged 74 and younger should be checked for chlamydia. Older women with new or multiple partners should also be tested for chlamydia. Testing for other STIs is recommended if you are sexually active and at increased risk.  Osteoporosis is a disease in which the bones lose minerals and strength with aging. This can result in serious bone fractures. The risk of osteoporosis can be identified using a bone density scan. Women ages 43 and over and women at risk for fractures or osteoporosis should discuss screening with their caregivers. Ask your caregiver whether you should take a calcium supplement or vitamin D to reduce the rate of osteoporosis.  Menopause can be associated with physical symptoms and risks. Hormone replacement therapy is available to decrease symptoms and risks. You should talk to your caregiver about whether hormone replacement therapy is right for you.  Use sunscreen with sun protection factor (SPF) of 30 or more. Apply sunscreen liberally and repeatedly throughout the day. You should seek shade when your shadow is shorter  than you. Protect yourself by wearing long sleeves, pants, a wide-brimmed hat, and sunglasses year round, whenever you are outdoors.  Once a month, do a whole body skin exam, using a mirror to look at the skin on your back. Notify your caregiver of new moles, moles that have irregular borders, moles that are larger than a pencil eraser, or moles that have changed in shape or color.  Stay current with required immunizations.  Influenza. You need a dose every fall (or winter). The composition of the flu vaccine changes  each year, so being vaccinated once is not enough.  Pneumococcal polysaccharide. You need 1 to 2 doses if you smoke cigarettes or if you have certain chronic medical conditions. You need 1 dose at age 14 (or older) if you have never been vaccinated.  Tetanus, diphtheria, pertussis (Tdap, Td). Get 1 dose of Tdap vaccine if you are younger than age 69, are over 77 and have contact with an infant, are a Research scientist (physical sciences), are pregnant, or simply want to be protected from whooping cough. After that, you need a Td booster dose every 10 years. Consult your caregiver if you have not had at least 3 tetanus and diphtheria-containing shots sometime in your life or have a deep or dirty wound.  HPV. You need this vaccine if you are a woman age 39 or younger. The vaccine is given in 3 doses over 6 months.  Measles, mumps, rubella (MMR). You need at least 1 dose of MMR if you were born in 1957 or later. You may also need a second dose.  Meningococcal. If you are age 57 to 56 and a first-year college student living in a residence hall, or have one of several medical conditions, you need to get vaccinated against meningococcal disease. You may also need additional booster doses.  Zoster (shingles). If you are age 85 or older, you should get this vaccine.  Varicella (chickenpox). If you have never had chickenpox or you were vaccinated but received only 1 dose, talk to your caregiver to find out if  you need this vaccine.  Hepatitis A. You need this vaccine if you have a specific risk factor for hepatitis A virus infection or you simply wish to be protected from this disease. The vaccine is usually given as 2 doses, 6 to 18 months apart.  Hepatitis B. You need this vaccine if you have a specific risk factor for hepatitis B virus infection or you simply wish to be protected from this disease. The vaccine is given in 3 doses, usually over 6 months. Preventive Services / Frequency Ages 18 to 31  Blood pressure check.** / Every 1 to 2 years.  Lipid and cholesterol check.** / Every 5 years beginning at age 37.  Clinical breast exam.** / Every 3 years for women in their 38s and 30s.  Pap test.** / Every 2 years from ages 46 through 19. Every 3 years starting at age 2 through age 33 or 81 with a history of 3 consecutive normal Pap tests.  HPV screening.** / Every 3 years from ages 61 through ages 3 to 69 with a history of 3 consecutive normal Pap tests.  Hepatitis C blood test.** / For any individual with known risks for hepatitis C.  Skin self-exam. / Monthly.  Influenza immunization.** / Every year.  Pneumococcal polysaccharide immunization.** / 1 to 2 doses if you smoke cigarettes or if you have certain chronic medical conditions.  Tetanus, diphtheria, pertussis (Tdap, Td) immunization. / A one-time dose of Tdap vaccine. After that, you need a Td booster dose every 10 years.  HPV immunization. / 3 doses over 6 months, if you are 2 and younger.  Measles, mumps, rubella (MMR) immunization. / You need at least 1 dose of MMR if you were born in 1957 or later. You may also need a second dose.  Meningococcal immunization. / 1 dose if you are age 73 to 57 and a first-year college student living in a residence hall, or have one of several medical conditions, you need to  get vaccinated against meningococcal disease. You may also need additional booster doses.  Varicella immunization.** /  Consult your caregiver.  Hepatitis A immunization.** / Consult your caregiver. 2 doses, 6 to 18 months apart.  Hepatitis B immunization.** / Consult your caregiver. 3 doses usually over 6 months. Ages 42 to 80  Blood pressure check.** / Every 1 to 2 years.  Lipid and cholesterol check.** / Every 5 years beginning at age 72.  Clinical breast exam.** / Every year after age 46.  Mammogram.** / Every year beginning at age 41 and continuing for as long as you are in good health. Consult with your caregiver.  Pap test.** / Every 3 years starting at age 48 through age 53 or 79 with a history of 3 consecutive normal Pap tests.  HPV screening.** / Every 3 years from ages 41 through ages 83 to 13 with a history of 3 consecutive normal Pap tests.  Fecal occult blood test (FOBT) of stool. / Every year beginning at age 65 and continuing until age 16. You may not need to do this test if you get a colonoscopy every 10 years.  Flexible sigmoidoscopy or colonoscopy.** / Every 5 years for a flexible sigmoidoscopy or every 10 years for a colonoscopy beginning at age 73 and continuing until age 46.  Hepatitis C blood test.** / For all people born from [birth year] through 1965 and any individual with known risks for hepatitis C.  Skin self-exam. / Monthly.  Influenza immunization.** / Every year.  Pneumococcal polysaccharide immunization.** / 1 to 2 doses if you smoke cigarettes or if you have certain chronic medical conditions.  Tetanus, diphtheria, pertussis (Tdap, Td) immunization.** / A one-time dose of Tdap vaccine. After that, you need a Td booster dose every 10 years.  Measles, mumps, rubella (MMR) immunization. / You need at least 1 dose of MMR if you were born in 1957 or later. You may also need a second dose.  Varicella immunization.** / Consult your caregiver.  Meningococcal immunization.** / Consult your caregiver.  Hepatitis A immunization.** / Consult your caregiver. 2 doses, 6 to 18 months  apart.  Hepatitis B immunization.** / Consult your caregiver. 3 doses, usually over 6 months. Ages 66 and over  Blood pressure check.** / Every 1 to 2 years.  Lipid and cholesterol check.** / Every 5 years beginning at age 40.  Clinical breast exam.** / Every year after age 63.  Mammogram.** / Every year beginning at age 84 and continuing for as long as you are in good health. Consult with your caregiver.  Pap test.** / Every 3 years starting at age 11 through age 52 or 3 with a 3 consecutive normal Pap tests. Testing can be stopped between 65 and 70 with 3 consecutive normal Pap tests and no abnormal Pap or HPV tests in the past 10 years.  HPV screening.** / Every 3 years from ages 26 through ages 11 or 71 with a history of 3 consecutive normal Pap tests. Testing can be stopped between 65 and 70 with 3 consecutive normal Pap tests and no abnormal Pap or HPV tests in the past 10 years.  Fecal occult blood test (FOBT) of stool. / Every year beginning at age 5 and continuing until age 62. You may not need to do this test if you get a colonoscopy every 10 years.  Flexible sigmoidoscopy or colonoscopy.** / Every 5 years for a flexible sigmoidoscopy or every 10 years for a colonoscopy beginning at age 31 and continuing  until age 22.  Hepatitis C blood test.** / For all people born from [birth year] through 1963 and any individual with known risks for hepatitis C.  Osteoporosis screening.** / A one-time screening for women ages 72 and over and women at risk for fractures or osteoporosis.  Skin self-exam. / Monthly.  Influenza immunization.** / Every year.  Pneumococcal polysaccharide immunization.** / 1 dose at age 51 (or older) if you have never been vaccinated.  Tetanus, diphtheria, pertussis (Tdap, Td) immunization. / A one-time dose of Tdap vaccine if you are over 65 and have contact with an infant, are a Research scientist (physical sciences), or simply want to be protected from whooping cough. After that, you  need a Td booster dose every 10 years.  Varicella immunization.** / Consult your caregiver.  Meningococcal immunization.** / Consult your caregiver.  Hepatitis A immunization.** / Consult your caregiver. 2 doses, 6 to 18 months apart.  Hepatitis B immunization.** / Check with your caregiver. 3 doses, usually over 6 months. ** Family history and personal history of risk and conditions may change your caregiver's recommendations. Document Released: 03/08/2001 Document Revised: 04/04/2011 Document Reviewed: 06/07/2010 [facility] Patient Information 2014 [place], [place].

## 2012-11-08 LAB — URINALYSIS, MICROSCOPIC ONLY
Bacteria, UA: NONE SEEN
Casts: NONE SEEN

## 2012-11-13 ENCOUNTER — Telehealth: Payer: Self-pay | Admitting: Nurse Practitioner

## 2012-11-13 DIAGNOSIS — J020 Streptococcal pharyngitis: Secondary | ICD-10-CM

## 2012-11-13 MED ORDER — AMOXICILLIN 875 MG PO TABS: 875.0000 mg | ORAL_TABLET | Freq: Two times a day (BID) | ORAL | Status: DC

## 2012-11-13 NOTE — Telephone Encounter (Signed)
LDL high, but other components of chol panel are very good-feel like this is r/t fat metabolism more than diet. Wonder if she would do better on armour or t4& t3? Also albumin is slightly, was very 2 ya when pregnant-again wonder if r/t thyroid. Will start VitD3 1000 iu daily. Throat cx pos. Will treat. Adv change toothbrush 24 hrs after starting abx.

## 2012-11-26 ENCOUNTER — Telehealth: Payer: Self-pay | Admitting: Nurse Practitioner

## 2012-11-26 DIAGNOSIS — E785 Hyperlipidemia, unspecified: Secondary | ICD-10-CM

## 2012-11-26 NOTE — Telephone Encounter (Signed)
See ph note

## 2012-12-11 ENCOUNTER — Telehealth: Payer: Self-pay | Admitting: Nurse Practitioner

## 2012-12-11 NOTE — Telephone Encounter (Signed)
Patient Information:  Caller Name: Lorriann  Phone: [phone]  Patient: [name], [name]  Gender: Female  DOB: Oct 06, 1980  Age: 32 Years  PCP: Maximino Sarin  Pregnant: No  Office Follow Up:  Does the office need to follow up with this patient?: No  Instructions For The Office: N/A   Symptoms  Reason For Call & Symptoms: Calling about starting with itchy eyes onset 12/10/12 and this morning she is having yellowish discharge from R eye. Afebrile. No other sx-12/11/12. Daughter recently treated for conjuctivites. Triage per Eye Discharge Protocol and Polytrim Eye Drops 2 gtts to both eyes QID x 5 days, no refills- per standing order- called into [facility] on [facility] #[phone] and spoke to [place], [place].  Reviewed Health History In EMR: Yes  Reviewed Medications In EMR: Yes  Reviewed Allergies In EMR: Yes  Reviewed Surgeries / Procedures: Yes  Date of Onset of Symptoms: 12/10/2012 OB / GYN:  LMP: Unknown  Guideline(s) Used:  Eye - Pus or Discharge  Disposition Per Guideline:   Home Care  Reason For Disposition Reached:   Eye with yellow/green discharge or eyelashes stick together, and PCP standing order to call in antibiotic eye drops  Advice Given:  Reassurance:  You can get pink eye from someone who has had it recently.  Pink eye will not harm your eyesight.  Eyelid Cleansing:   Gently wash eyelids and lashes with warm water and wet cotton balls (or cotton gauze). Remove all the dried and liquid pus.  Do this as often as needed.  Contagiousness:  Pinkeye is contagious. It is very easy to spread to other people. You can spread it by shaking hands.  You may return to work or school. Avoid physical contact (e.g., shaking hands) until the symptoms have resolved.  Call Back If  Pus increases in amount  Pus in corner of eye lasts over 3 days  Eyelid becomes red or swollen  You become worse.  Reassurance:  Antibiotic eye drops work well to treat pink  eye.  Prescription Option for Antibiotic Eyedrops  Per Protocol - [place]:   Prescription: Polytrim (polymyxin-trimethoprim) eyedrops, 5 ml bottle ($12)  Dosing: 1 to 2 drops four times daily for 5 to 7 days. Note: drop covers the adult eye.  Additional Instructions: Continue using eyedrops until you have awakened two mornings without pus in the eyes.  Expected Course:   With treatment, the yellow discharge should clear up in 3 days. The red eyes may persist for several more days.  Call Back If  Pus lasts over 3 days (72 hours) on treatment  Blurred vision develops  More than just mild discomfort  You become worse.  Patient Will Follow Care Advice:  YES

## 2012-12-11 NOTE — Telephone Encounter (Signed)
FYI

## 2013-01-28 ENCOUNTER — Other Ambulatory Visit (HOSPITAL_COMMUNITY): Payer: Self-pay | Admitting: Endocrinology

## 2013-01-28 DIAGNOSIS — C73 Malignant neoplasm of thyroid gland: Secondary | ICD-10-CM

## 2013-02-18 ENCOUNTER — Encounter (HOSPITAL_COMMUNITY)
Admission: RE | Admit: 2013-02-18 | Discharge: 2013-02-18 | Disposition: A | Discharge status: Home / Self Care | Payer: BC Managed Care – PPO | Source: Ambulatory Visit | Attending: Endocrinology | Admitting: Endocrinology

## 2013-02-18 DIAGNOSIS — C73 Malignant neoplasm of thyroid gland: Secondary | ICD-10-CM | POA: Insufficient documentation

## 2013-02-18 MED ORDER — THYROTROPIN ALFA 1.1 MG IM SOLR
0.9000 mg | INTRAMUSCULAR | Status: AC
  Administered 2013-02-18: 0.9 mg via INTRAMUSCULAR

## 2013-02-19 ENCOUNTER — Encounter (HOSPITAL_COMMUNITY)
Admission: RE | Admit: 2013-02-19 | Discharge: 2013-02-19 | Disposition: A | Discharge status: Home / Self Care | Payer: BC Managed Care – PPO | Source: Ambulatory Visit | Attending: Endocrinology | Admitting: Endocrinology

## 2013-02-19 DIAGNOSIS — C73 Malignant neoplasm of thyroid gland: Secondary | ICD-10-CM | POA: Insufficient documentation

## 2013-02-19 MED ORDER — THYROTROPIN ALFA 1.1 MG IM SOLR
0.9000 mg | INTRAMUSCULAR | Status: AC
  Administered 2013-02-19: 0.9 mg via INTRAMUSCULAR

## 2013-02-20 ENCOUNTER — Encounter (HOSPITAL_COMMUNITY)
Admission: RE | Admit: 2013-02-20 | Discharge: 2013-02-20 | Disposition: A | Discharge status: Home / Self Care | Payer: BC Managed Care – PPO | Source: Ambulatory Visit | Attending: Endocrinology | Admitting: Endocrinology

## 2013-02-20 DIAGNOSIS — C73 Malignant neoplasm of thyroid gland: Secondary | ICD-10-CM | POA: Insufficient documentation

## 2013-02-20 LAB — HCG, SERUM, QUALITATIVE: Preg, Serum: NEGATIVE

## 2013-02-20 MED ORDER — SODIUM IODIDE I 131 CAPSULE
4.0000 | Freq: Once | INTRAVENOUS | Status: AC | PRN
  Administered 2013-02-20: 4 via ORAL

## 2013-02-22 ENCOUNTER — Encounter (HOSPITAL_COMMUNITY)
Admission: RE | Admit: 2013-02-22 | Discharge: 2013-02-22 | Disposition: A | Discharge status: Home / Self Care | Payer: BC Managed Care – PPO | Source: Ambulatory Visit | Attending: Endocrinology | Admitting: Endocrinology

## 2013-02-22 DIAGNOSIS — C73 Malignant neoplasm of thyroid gland: Secondary | ICD-10-CM | POA: Insufficient documentation

## 2013-09-16 ENCOUNTER — Encounter: Payer: Self-pay | Admitting: Nurse Practitioner

## 2013-09-16 ENCOUNTER — Ambulatory Visit (INDEPENDENT_AMBULATORY_CARE_PROVIDER_SITE_OTHER): Payer: BC Managed Care – PPO | Admitting: Nurse Practitioner

## 2013-09-16 VITALS — BP 93/57 | HR 63 | Temp 98.0°F | Resp 16 | Ht 61.0 in | Wt 136.0 lb

## 2013-09-16 DIAGNOSIS — T162XXA Foreign body in left ear, initial encounter: Secondary | ICD-10-CM

## 2013-09-16 DIAGNOSIS — IMO0002 Reserved for concepts with insufficient information to code with codable children: Secondary | ICD-10-CM

## 2013-09-16 DIAGNOSIS — T169XXA Foreign body in ear, unspecified ear, initial encounter: Secondary | ICD-10-CM

## 2013-09-16 NOTE — Patient Instructions (Signed)
The silverfish is removed.  No follow up necessary!

## 2013-09-16 NOTE — Progress Notes (Signed)
Pre visit review using our clinic review tool, if applicable. No additional management support is needed unless otherwise documented below in the visit note. 

## 2013-09-16 NOTE — Progress Notes (Signed)
   Subjective:    Patient ID: [name], female    DOB: 08-24-1980, 33 y.o.   MRN: [id number]  Otalgia  There is pain in the left (pt woke w/pain in L ear this morning. R ear feels stuffy.) ear. This is a new problem. The current episode started today. The problem occurs constantly. There has been no fever. The pain is moderate. Associated symptoms include headaches. Pertinent negatives include no abdominal pain, coughing, diarrhea, drainage, ear discharge, hearing loss, rash or sore throat. She has tried nothing for the symptoms.      Review of Systems  Constitutional: Negative for fever, chills, appetite change and fatigue.  HENT: Positive for ear pain. Negative for dental problem, ear discharge, hearing loss, sinus pressure and sore throat.   Respiratory: Negative for cough.   Gastrointestinal: Negative for abdominal pain and diarrhea.  Skin: Negative for rash.  Neurological: Positive for headaches.       Objective:   Physical Exam  Vitals reviewed. Constitutional: She is oriented to person, place, and time. She appears well-developed and well-nourished.  HENT:  Head: Normocephalic and atraumatic.  Right Ear: External ear normal.  Mouth/Throat: Oropharynx is clear and moist. No oropharyngeal exudate.  Insect in L canal. TM intact. Canal nml otherwise. Insect crawled out of canal after putting 2 drops 1 % lidocaine in ear. Insect captured in tissue-looks like silver fish.  Eyes: Conjunctivae are normal. Right eye exhibits no discharge. Left eye exhibits no discharge.  Neck: No thyromegaly present.  Cardiovascular: Normal rate.   Pulmonary/Chest: Effort normal. No respiratory distress.  Lymphadenopathy:    She has no cervical adenopathy.  Neurological: She is alert and oriented to person, place, and time.  Skin: Skin is warm and dry.  Psychiatric: She has a normal mood and affect. Her behavior is normal. Thought content normal.          Assessment & Plan:    Foreign body in ear, L.  Removed 1 silverfish, alive. No f/u necessary.

## 2013-11-25 ENCOUNTER — Encounter: Payer: Self-pay | Admitting: Nurse Practitioner

## 2013-12-09 ENCOUNTER — Ambulatory Visit (INDEPENDENT_AMBULATORY_CARE_PROVIDER_SITE_OTHER): Payer: BC Managed Care – PPO | Admitting: Nurse Practitioner

## 2013-12-09 ENCOUNTER — Encounter: Payer: Self-pay | Admitting: Nurse Practitioner

## 2013-12-09 VITALS — BP 110/76 | HR 78 | Temp 98.2°F | Resp 18 | Ht 61.0 in | Wt 136.0 lb

## 2013-12-09 DIAGNOSIS — Z23 Encounter for immunization: Secondary | ICD-10-CM

## 2013-12-09 DIAGNOSIS — J0100 Acute maxillary sinusitis, unspecified: Secondary | ICD-10-CM

## 2013-12-09 MED ORDER — AMOXICILLIN-POT CLAVULANATE 875-125 MG PO TABS: 1.0000 | ORAL_TABLET | Freq: Two times a day (BID) | ORAL | Status: DC

## 2013-12-09 NOTE — Progress Notes (Signed)
   Subjective:    Patient ID: [name], female    DOB: Jun 02, 1980, 33 y.o.   MRN: [id number]  Cough This is a chronic problem. The current episode started 1 to 4 weeks ago (3 wks). The problem has been waxing and waning. The cough is productive of sputum. Associated symptoms include ear congestion, headaches, nasal congestion and postnasal drip. Pertinent negatives include no chest pain, chills, fever, sore throat, shortness of breath or wheezing. Nothing aggravates the symptoms. Treatments tried: tylenol & ibuprophen for headache. The treatment provided mild relief.      Review of Systems  Constitutional: Negative for fever, chills and fatigue.  HENT: Positive for congestion, postnasal drip and sinus pressure. Negative for sore throat.   Respiratory: Positive for cough. Negative for shortness of breath and wheezing.   Cardiovascular: Negative for chest pain.  Neurological: Positive for headaches.       Objective:   Physical Exam  Constitutional: She is oriented to person, place, and time. She appears well-developed and well-nourished. No distress.  HENT:  Head: Normocephalic and atraumatic.  Right Ear: External ear normal.  Left Ear: External ear normal.  Mouth/Throat: Oropharynx is clear and moist. No oropharyngeal exudate.  Eyes: Conjunctivae are normal. Right eye exhibits no discharge. Left eye exhibits no discharge.  Neck: Neck supple. No thyromegaly present.  Cardiovascular: Normal rate, regular rhythm and normal heart sounds.   No murmur heard. Pulmonary/Chest: Effort normal and breath sounds normal. No respiratory distress. She has no wheezes. She has no rales.  Lymphadenopathy:    She has no cervical adenopathy.  Neurological: She is alert and oriented to person, place, and time.  Skin: Skin is warm and dry.  Psychiatric: She has a normal mood and affect. Her behavior is normal. Thought content normal.  Vitals reviewed.         Assessment & Plan:  1.  Acute maxillary sinusitis, recurrence not specified - amoxicillin-clavulanate (AUGMENTIN) 875-125 MG per tablet; Take 1 tablet by mouth 2 (two) times daily.  Dispense: 10 tablet; Refill: 0  See patient instructions for complete plan. F/u PRN

## 2013-12-09 NOTE — Progress Notes (Signed)
Pre visit review using our clinic review tool, if applicable. No additional management support is needed unless otherwise documented below in the visit note. 

## 2013-12-09 NOTE — Patient Instructions (Signed)
Start antibiotic. Eat yogurt daily at lunch or afternoon to help prevent diarrhea that can be caused by antibiotic. Start daily sinus rinses (Neilmed Sinus rinse) for at least 5-7 days. You may use pseudoephedrine 30 mg twice daily for 4-6 days. Please call for re-evaluation if you are not improving.   Sinusitis Sinusitis is redness, soreness, and swelling (inflammation) of the paranasal sinuses. Paranasal sinuses are air pockets within the bones of your face (beneath the eyes, the middle of the forehead, or above the eyes). In healthy paranasal sinuses, mucus is able to drain out, and air is able to circulate through them by way of your nose. However, when your paranasal sinuses are inflamed, mucus and air can become trapped. This can allow bacteria and other germs to grow and cause infection. Sinusitis can develop quickly and last only a short time (acute) or continue over a long period (chronic). Sinusitis that lasts for more than 12 weeks is considered chronic.  CAUSES  Causes of sinusitis include:  Allergies.  Structural abnormalities, such as displacement of the cartilage that separates your nostrils (deviated septum), which can decrease the air flow through your nose and sinuses and affect sinus drainage.  Functional abnormalities, such as when the small hairs (cilia) that line your sinuses and help remove mucus do not work properly or are not present. SYMPTOMS  Symptoms of acute and chronic sinusitis are the same. The primary symptoms are pain and pressure around the affected sinuses. Other symptoms include:  Upper toothache.  Earache.  Headache.  Bad breath.  Decreased sense of smell and taste.  A cough, which worsens when you are lying flat.  Fatigue.  Fever.  Thick drainage from your nose, which often is green and may contain pus (purulent).  Swelling and warmth over the affected sinuses. DIAGNOSIS  Your caregiver will perform a physical exam. During the exam, your  caregiver may:  Look in your nose for signs of abnormal growths in your nostrils (nasal polyps).  Tap over the affected sinus to check for signs of infection.  View the inside of your sinuses (endoscopy) with a special imaging device with a light attached (endoscope), which is inserted into your sinuses. If your caregiver suspects that you have chronic sinusitis, one or more of the following tests may be recommended:  Allergy tests.  Nasal culture A sample of mucus is taken from your nose and sent to a lab and screened for bacteria.  Nasal cytology A sample of mucus is taken from your nose and examined by your caregiver to determine if your sinusitis is related to an allergy. TREATMENT  Most cases of acute sinusitis are related to a viral infection and will resolve on their own within 10 days. Sometimes medicines are prescribed to help relieve symptoms (pain medicine, decongestants, nasal steroid sprays, or saline sprays).  However, for sinusitis related to a bacterial infection, your caregiver will prescribe antibiotic medicines. These are medicines that will help kill the bacteria causing the infection.  Rarely, sinusitis is caused by a fungal infection. In theses cases, your caregiver will prescribe antifungal medicine. For some cases of chronic sinusitis, surgery is needed. Generally, these are cases in which sinusitis recurs more than 3 times per year, despite other treatments. HOME CARE INSTRUCTIONS   Drink plenty of water. Water helps thin the mucus so your sinuses can drain more easily.  Use a humidifier.  Inhale steam 3 to 4 times a day (for example, sit in the bathroom with the shower running).  Apply a warm, moist washcloth to your face 3 to 4 times a day, or as directed by your caregiver.  Use saline nasal sprays to help moisten and clean your sinuses.  Take over-the-counter or prescription medicines for pain, discomfort, or fever only as directed by your caregiver. SEEK  IMMEDIATE MEDICAL CARE IF:  You have increasing pain or severe headaches.  You have nausea, vomiting, or drowsiness.  You have swelling around your face.  You have vision problems.  You have a stiff neck.  You have difficulty breathing. MAKE SURE YOU:   Understand these instructions.  Will watch your condition.  Will get help right away if you are not doing well or get worse. Document Released: 01/10/2005 Document Revised: 04/04/2011 Document Reviewed: 01/25/2011 [facility] Patient Information 2014 [place], [place].

## 2013-12-25 ENCOUNTER — Ambulatory Visit (INDEPENDENT_AMBULATORY_CARE_PROVIDER_SITE_OTHER): Payer: BC Managed Care – PPO | Admitting: Nurse Practitioner

## 2013-12-25 ENCOUNTER — Encounter: Payer: Self-pay | Admitting: Nurse Practitioner

## 2013-12-25 VITALS — BP 108/75 | HR 73 | Temp 97.7°F | Resp 18 | Ht 61.0 in | Wt 139.0 lb

## 2013-12-25 DIAGNOSIS — S63502A Unspecified sprain of left wrist, initial encounter: Secondary | ICD-10-CM

## 2013-12-25 NOTE — Progress Notes (Signed)
   Subjective:    Patient ID: [name], female    DOB: 09-11-1980, 33 y.o.   MRN: [id number]  HPI Comments: Pt states her dog bit her several times while she was trying to get his paw unstuck. Now she has swollen nodule on hand-painful when lifting or torsion.  Hand Pain  The incident occurred more than 1 week ago (3 weeks ago). The incident occurred at home. Injury mechanism: crush injury-dog bite. The pain is present in the left hand. The quality of the pain is described as shooting. The pain does not radiate. The pain is mild. The pain has been intermittent since the incident. Pertinent negatives include no muscle weakness, numbness or tingling. The symptoms are aggravated by lifting. She has tried nothing for the symptoms.      Review of Systems  Constitutional: Negative for fever.  Musculoskeletal: Positive for arthralgias.  Skin: Positive for color change (marks from dog bites).  Neurological: Negative for tingling and numbness.  Hematological: Negative for adenopathy.       Objective:   Physical Exam  Constitutional: She appears well-developed and well-nourished. No distress.  HENT:  Head: Normocephalic and atraumatic.  Eyes: Conjunctivae are normal. Right eye exhibits no discharge. Left eye exhibits no discharge.  Cardiovascular: Normal rate.   Pulmonary/Chest: Effort normal. No respiratory distress.  Musculoskeletal: She exhibits edema and tenderness.       Left wrist: She exhibits tenderness, swelling and deformity.       Arms: Skin: Skin is warm and dry.  Multiple 3 mm purple marks of post-inflammatory change from dog bites. She has 2 marks near nodule & correlating marks on volar side of hand (upper & lower teeth of dog).  Psychiatric: She has a normal mood and affect. Her behavior is normal. Thought content normal.  Vitals reviewed.         Assessment & Plan:  1. Left wrist sprain, initial encounter Suspect tenosynovitis of extensor tendon in L hand  secondary to dog bite. Splint to support & decrease mobility. aspercream & heat tid for several weeks. F/u PRN no improvement

## 2013-12-25 NOTE — Progress Notes (Signed)
Pre visit review using our clinic review tool, if applicable. No additional management support is needed unless otherwise documented below in the visit note. 

## 2013-12-25 NOTE — Patient Instructions (Signed)
It is likely that you have suffered a tendon injury as a result of the dog bite.  Wear wrist splint for support & comfort. Take off as needed. Wear for several weeks or as long as it provides comfort. Injured ligaments/tendons can take 8 to 12 weeks for complete healing.  Apply aspercream three times daily over tendon: dime-sized amount. You may follow w/10 minutes of heat.  Let me know if swelling & pain are not improving.  Nice to see you!

## 2014-03-18 ENCOUNTER — Encounter: Payer: Self-pay | Admitting: Nurse Practitioner

## 2014-03-18 ENCOUNTER — Ambulatory Visit (INDEPENDENT_AMBULATORY_CARE_PROVIDER_SITE_OTHER): Payer: BLUE CROSS/BLUE SHIELD | Admitting: Nurse Practitioner

## 2014-03-18 VITALS — BP 104/70 | HR 75 | Temp 98.7°F | Ht 61.0 in | Wt 136.0 lb

## 2014-03-18 DIAGNOSIS — J0100 Acute maxillary sinusitis, unspecified: Secondary | ICD-10-CM

## 2014-03-18 NOTE — Progress Notes (Signed)
Subjective:     [name] is a 34 y.o. female who presents for evaluation of sinus pain. Pt has had nasal drainage for 1 month, facial pain & upper teeth pain started 2 weeks ago. Sore throat in mornings. Denies fever, cough, ear pain. Went to dentist 1 wk ago as thought she had cavity. Dentist determine she had sinus infection & started amoxicillin 500 mg tid X 10 d. She has had some improvement in HA & teeth pain, but concerned that HA are still occuring behind R eye. She is on day 7 of amoxil. She is also using mucinex max (has acetaminophen & decongestant) with significant relief, but HA returns when med wears off.  The following portions of the patient's history were reviewed and updated as appropriate: allergies, current medications, past medical history, past social history, past surgical history and problem list.  Review of Systems Gastrointestinal: positive for 2 loose stools daily for 4 days   Objective:    BP 104/70 mmHg  Pulse 75  Temp(Src) 98.7 F (37.1 C) (Oral)  Ht 5\' 1"  (1.549 m)  Wt 136 lb (61.689 kg)  BMI 25.71 kg/m2  SpO2 97% General appearance: alert, cooperative, appears stated age, no distress and she is accompanied by young son today Head: Normocephalic, without obvious abnormality, atraumatic Eyes: negative findings: lids and lashes normal and conjunctivae and sclerae normal Ears: normal TM's and external ear canals both ears and scant clear fluid behind L TM Nose: scant crusts R nasal mucosa   Throat: lips, mucosa, and tongue normal; teeth and gums normal Neck: no adenopathy, supple, symmetrical, trachea midline and thyroid not enlarged, symmetric, no tenderness/mass/nodules Lungs: clear to auscultation bilaterally Heart: regular rate and rhythm, S1, S2 normal, no murmur, click, rub or gallop    Assessment:    Acute bacterial sinusitis.    Plan:    finish amoxicllin   Use probiotic or yogurt Sinus rinse twice daily Pseudoephedrine &  ibuprophen Stop mucinex See pt instructions F/u PRN

## 2014-03-18 NOTE — Progress Notes (Signed)
Pre visit review using our clinic review tool, if applicable. No additional management support is needed unless otherwise documented below in the visit note. 

## 2014-03-18 NOTE — Patient Instructions (Signed)
Finish antibiotic. Eat yogurt or take probiotic 2 hours after taking antibiotic.  Start Neilmed sinus rinse. Use twice daily for about 1 week.  Use 30 mg pseudoephedrine and 200-400 mg ibuprophen 2 to 3 times daily for about 1 week or until sinus pressure is relieved. Do not use mucinex max with this. You may use it instead of, if it works better for you.  If not feeling better by Thursday or Friday. Let me know & I will start augmentin.

## 2015-01-03 ENCOUNTER — Emergency Department (HOSPITAL_COMMUNITY)
Admission: EM | Admit: 2015-01-03 | Discharge: 2015-01-03 | Disposition: A | Discharge status: Home / Self Care | Payer: BLUE CROSS/BLUE SHIELD | Attending: Emergency Medicine | Admitting: Emergency Medicine

## 2015-01-03 ENCOUNTER — Encounter (HOSPITAL_COMMUNITY): Payer: Self-pay | Admitting: Emergency Medicine

## 2015-01-03 DIAGNOSIS — Z3202 Encounter for pregnancy test, result negative: Secondary | ICD-10-CM | POA: Diagnosis not present

## 2015-01-03 DIAGNOSIS — Z8679 Personal history of other diseases of the circulatory system: Secondary | ICD-10-CM | POA: Diagnosis not present

## 2015-01-03 DIAGNOSIS — Z85850 Personal history of malignant neoplasm of thyroid: Secondary | ICD-10-CM | POA: Insufficient documentation

## 2015-01-03 DIAGNOSIS — R002 Palpitations: Secondary | ICD-10-CM | POA: Diagnosis present

## 2015-01-03 DIAGNOSIS — Z79899 Other long term (current) drug therapy: Secondary | ICD-10-CM | POA: Diagnosis not present

## 2015-01-03 DIAGNOSIS — R42 Dizziness and giddiness: Secondary | ICD-10-CM | POA: Insufficient documentation

## 2015-01-03 DIAGNOSIS — E039 Hypothyroidism, unspecified: Secondary | ICD-10-CM | POA: Insufficient documentation

## 2015-01-03 DIAGNOSIS — R110 Nausea: Secondary | ICD-10-CM | POA: Insufficient documentation

## 2015-01-03 DIAGNOSIS — R000 Tachycardia, unspecified: Secondary | ICD-10-CM | POA: Insufficient documentation

## 2015-01-03 LAB — COMPREHENSIVE METABOLIC PANEL
ALBUMIN: 3.5 g/dL (ref 3.5–5.0)
ALT: 17 U/L (ref 14–54)
ANION GAP: 6 (ref 5–15)
AST: 18 U/L (ref 15–41)
Alkaline Phosphatase: 64 U/L (ref 38–126)
BUN: 16 mg/dL (ref 6–20)
CHLORIDE: 101 mmol/L (ref 101–111)
CO2: 25 mmol/L (ref 22–32)
Calcium: 8.5 mg/dL — ABNORMAL LOW (ref 8.9–10.3)
Creatinine, Ser: 0.85 mg/dL (ref 0.44–1.00)
GFR calc Af Amer: >60 mL/min (ref >60)
GFR calc non Af Amer: >60 mL/min (ref >60)
GLUCOSE: 101 mg/dL — AB (ref 65–99)
POTASSIUM: 3.4 mmol/L — AB (ref 3.5–5.1)
Sodium: 132 mmol/L — ABNORMAL LOW (ref 135–145)
Total Bilirubin: 1.2 mg/dL (ref 0.3–1.2)
Total Protein: 7.2 g/dL (ref 6.5–8.1)

## 2015-01-03 LAB — CBC WITH DIFFERENTIAL/PLATELET
BASOS ABS: 0 10*3/uL (ref 0.0–0.1)
BASOS PCT: 0 %
EOS ABS: 0.1 10*3/uL (ref 0.0–0.7)
EOS PCT: 1 %
HCT: 41.3 % (ref 36.0–46.0)
Hemoglobin: 14 g/dL (ref 12.0–15.0)
Lymphocytes Relative: 16 %
Lymphs Abs: 1.9 10*3/uL (ref 0.7–4.0)
MCH: 29.8 pg (ref 26.0–34.0)
MCHC: 33.9 g/dL (ref 30.0–36.0)
MCV: 87.9 fL (ref 78.0–100.0)
MONO ABS: 0.9 10*3/uL (ref 0.1–1.0)
Monocytes Relative: 8 %
NEUTROS ABS: 9 10*3/uL — AB (ref 1.7–7.7)
Neutrophils Relative %: 75 %
PLATELETS: 261 10*3/uL (ref 150–400)
RBC: 4.7 MIL/uL (ref 3.87–5.11)
RDW: 12.8 % (ref 11.5–15.5)
WBC: 11.9 10*3/uL — ABNORMAL HIGH (ref 4.0–10.5)

## 2015-01-03 LAB — D-DIMER, QUANTITATIVE (NOT AT ARMC): D-Dimer, Quant: 0.3 ug/mL-FEU (ref 0.00–0.50)

## 2015-01-03 LAB — I-STAT CG4 LACTIC ACID, ED: LACTIC ACID, VENOUS: 0.93 mmol/L (ref 0.5–2.0)

## 2015-01-03 LAB — I-STAT BETA HCG BLOOD, ED (MC, WL, AP ONLY): I-stat hCG, quantitative: <5 m[IU]/mL (ref <5)

## 2015-01-03 MED ORDER — LORAZEPAM 2 MG/ML IJ SOLN
1.0000 mg | Freq: Once | INTRAMUSCULAR | Status: AC
  Administered 2015-01-03: 1 mg via INTRAVENOUS
  Filled 2015-01-03: qty 1

## 2015-01-03 MED ORDER — SODIUM CHLORIDE 0.9 % IV SOLN
1000.0000 mL | INTRAVENOUS | Status: DC
  Administered 2015-01-03: 1000 mL via INTRAVENOUS

## 2015-01-03 MED ORDER — ONDANSETRON HCL 4 MG PO TABS: 4.0000 mg | ORAL_TABLET | Freq: Four times a day (QID) | ORAL | Status: DC | PRN

## 2015-01-03 MED ORDER — ONDANSETRON HCL 4 MG/2ML IJ SOLN
4.0000 mg | Freq: Once | INTRAMUSCULAR | Status: AC
  Administered 2015-01-03: 4 mg via INTRAVENOUS
  Filled 2015-01-03: qty 2

## 2015-01-03 MED ORDER — SODIUM CHLORIDE 0.9 % IV SOLN
1000.0000 mL | Freq: Once | INTRAVENOUS | Status: AC
  Administered 2015-01-03: 1000 mL via INTRAVENOUS

## 2015-01-03 NOTE — Discharge Instructions (Signed)
Palpitations A palpitation is the feeling that your heartbeat is irregular or is faster than normal. It may feel like your heart is fluttering or skipping a beat. Palpitations are usually not a serious problem. However, in some cases, you may need further medical evaluation. CAUSES  Palpitations can be caused by:  Smoking.  Caffeine or other stimulants, such as diet pills or energy drinks.  Alcohol.  Stress and anxiety.  Strenuous physical activity.  Fatigue.  Certain medicines.  Heart disease, especially if you have a history of irregular heart rhythms (arrhythmias), such as atrial fibrillation, atrial flutter, or supraventricular tachycardia.  An improperly working pacemaker or defibrillator. DIAGNOSIS  To find the cause of your palpitations, your health care provider will take your medical history and perform a physical exam. Your health care provider may also have you take a test called an ambulatory electrocardiogram (ECG). An ECG records your heartbeat patterns over a 24-hour period. You may also have other tests, such as:  Transthoracic echocardiogram (TTE). During echocardiography, sound waves are used to evaluate how blood flows through your heart.  Transesophageal echocardiogram (TEE).  Cardiac monitoring. This allows your health care provider to monitor your heart rate and rhythm in real time.  Holter monitor. This is a portable device that records your heartbeat and can help diagnose heart arrhythmias. It allows your health care provider to track your heart activity for several days, if needed.  Stress tests by exercise or by giving medicine that makes the heart beat faster. TREATMENT  Treatment of palpitations depends on the cause of your symptoms and can vary greatly. Most cases of palpitations do not require any treatment other than time, relaxation, and monitoring your symptoms. Other causes, such as atrial fibrillation, atrial flutter, or supraventricular  tachycardia, usually require further treatment. HOME CARE INSTRUCTIONS   Avoid:  Caffeinated coffee, tea, soft drinks, diet pills, and energy drinks.  Chocolate.  Alcohol.  Stop smoking if you smoke.  Reduce your stress and anxiety. Things that can help you relax include:  A method of controlling things in your body, such as your heartbeats, with your mind (biofeedback).  Yoga.  Meditation.  Physical activity such as swimming, jogging, or walking.  Get plenty of rest and sleep. SEEK MEDICAL CARE IF:   You continue to have a fast or irregular heartbeat beyond 24 hours.  Your palpitations occur more often. SEEK IMMEDIATE MEDICAL CARE IF:  You have chest pain or shortness of breath.  You have a severe headache.  You feel dizzy or you faint. MAKE SURE YOU:  Understand these instructions.  Will watch your condition.  Will get help right away if you are not doing well or get worse.   This information is not intended to replace advice given to you by your health care provider. Make sure you discuss any questions you have with your health care provider.   Document Released: 01/08/2000 Document Revised: 01/15/2013 Document Reviewed: 03/11/2011 Elsevier Interactive Patient Education 2016 [employer]  Ondansetron tablets What is this medicine? ONDANSETRON (on DAN se tron) is used to treat nausea and vomiting caused by chemotherapy. It is also used to prevent or treat nausea and vomiting after surgery. This medicine may be used for other purposes; ask your health care provider or pharmacist if you have questions. What should I tell my health care provider before I take this medicine? They need to know if you have any of these conditions: -heart disease -history of irregular heartbeat -liver disease -low levels of  magnesium or potassium in the blood -an unusual or allergic reaction to ondansetron, granisetron, other medicines, foods, dyes, or preservatives -pregnant  or trying to get pregnant -breast-feeding How should I use this medicine? Take this medicine by mouth with a glass of water. Follow the directions on your prescription label. Take your doses at regular intervals. Do not take your medicine more often than directed. Talk to your pediatrician regarding the use of this medicine in children. Special care may be needed. Overdosage: If you think you have taken too much of this medicine contact a poison control center or emergency room at once. NOTE: This medicine is only for you. Do not share this medicine with others. What if I miss a dose? If you miss a dose, take it as soon as you can. If it is almost time for your next dose, take only that dose. Do not take double or extra doses. What may interact with this medicine? Do not take this medicine with any of the following medications: -apomorphine -certain medicines for fungal infections like fluconazole, itraconazole, ketoconazole, posaconazole, voriconazole -cisapride -dofetilide -dronedarone -pimozide -thioridazine -ziprasidone This medicine may also interact with the following medications: -carbamazepine -certain medicines for depression, anxiety, or psychotic disturbances -fentanyl -linezolid -MAOIs like Carbex, Eldepryl, Marplan, Nardil, and Parnate -methylene blue (injected into a vein) -other medicines that prolong the QT interval (cause an abnormal heart rhythm) -phenytoin -rifampicin -tramadol This list may not describe all possible interactions. Give your health care provider a list of all the medicines, herbs, non-prescription drugs, or dietary supplements you use. Also tell them if you smoke, drink alcohol, or use illegal drugs. Some items may interact with your medicine. What should I watch for while using this medicine? Check with your doctor or health care professional right away if you have any sign of an allergic reaction. What side effects may I notice from receiving this  medicine? Side effects that you should report to your doctor or health care professional as soon as possible: -allergic reactions like skin rash, itching or hives, swelling of the face, lips or tongue -breathing problems -confusion -dizziness -fast or irregular heartbeat -feeling faint or lightheaded, falls -fever and chills -loss of balance or coordination -seizures -sweating -swelling of the hands or feet -tightness in the chest -tremors -unusually weak or tired Side effects that usually do not require medical attention (report to your doctor or health care professional if they continue or are bothersome): -constipation or diarrhea -headache This list may not describe all possible side effects. Call your doctor for medical advice about side effects. You may report side effects to FDA at 1-800-FDA-1088. Where should I keep my medicine? Keep out of the reach of children. Store between 2 and 30 degrees C (36 and 86 degrees F). Throw away any unused medicine after the expiration date. NOTE: This sheet is a summary. It may not cover all possible information. If you have questions about this medicine, talk to your doctor, pharmacist, or health care provider.    2016, Elsevier/Gold Standard. (2012-10-17 16:27:45)

## 2015-01-03 NOTE — ED Provider Notes (Signed)
CSN: [id number]     Arrival date & time 01/03/15  0144 History  By signing my name below, I, Emmanuella Mensah, attest that this documentation has been prepared under the direction and in the presence of Delora Fuel, MD. Electronically Signed: Judithann Sauger, ED Scribe. 01/03/2015. 2:50 AM.    Chief Complaint  Patient presents with  . Anxiety   The history is provided by the patient. No language interpreter was used.   HPI Comments: [name] is a 34 y.o. female with a hx of thyroid cancer who presents to the Emergency Department complaining of gradually worsening palpitations and intermittent episodes of shaking with lightheadedness onset 2 and a half hours ago waking her up from her sleep. She explains her symptoms as "my heart is about to jump out of my chest". She reports associated nausea. She denies any vomiting, chest tightness, chest pain, or SOB. She states that she took Zofran with no relief PTA. She reports that these symptoms have been more frequent this week but have been intermittent since she was younger with anxiety attacks. She states that it only happens at night and she feels fine in during the day. She reports that she currently has an IUD. She reports that she went to [place] by car recently after thanksgiving.   PCP: Dr. Kathlen Mody but she is moving so will see Dr Anitra Lauth, another physician at the practice.    Past Medical History  Diagnosis Date  . S/P thyroidectomy 2005    Hx of thyroid cancer  . Dysrhythmia     seeing cardiologist for tachycardia on meds  . Hypothyroidism     had thyroid cancer  . Tachycardia   . SVD (spontaneous vaginal delivery) 09/23/2010  . Cancer [facility])     Thyroid   Past Surgical History  Procedure Laterality Date  . Thyroidectomy  2004  . Mouth surgery     Family History  Problem Relation Age of Onset  . Diabetes Father   . Cancer Maternal Aunt     leaukemia  . Hypertension Maternal Grandmother   . Asthma Maternal  Grandfather   . Diabetes Maternal Grandfather   . Stroke Maternal Grandfather   . Mitral valve prolapse Sister   . Diabetes Paternal Grandmother   . Stroke Paternal Grandmother    Social History  Substance Use Topics  . Smoking status: Never Smoker   . Smokeless tobacco: None  . Alcohol Use: No     Comment: occasional   OB History    Gravida Para Term Preterm AB TAB SAB Ectopic Multiple Living   2 2 2       2      Review of Systems  Constitutional: Negative for fever and chills.  Respiratory: Negative for chest tightness and shortness of breath.   Cardiovascular: Positive for palpitations. Negative for chest pain.  Gastrointestinal: Positive for nausea. Negative for vomiting and diarrhea.  Neurological: Positive for light-headedness.  All other systems reviewed and are negative.     Allergies  Azithromycin  Home Medications   Prior to Admission medications   Medication Sig Start Date End Date Taking? Authorizing Provider  Amoxicillin-Pot Clavulanate (AUGMENTIN PO) Take by mouth.    Historical Provider, MD  levothyroxine (SYNTHROID, LEVOTHROID) 112 MCG tablet Take 112 mcg by mouth 2 (two) times daily.      Historical Provider, MD  levothyroxine (SYNTHROID, LEVOTHROID) 125 MCG tablet Take 125 mcg by mouth 2 (two) times daily.    Historical Provider, MD  sertraline (  ZOLOFT) 50 MG tablet Take 25 mg by mouth daily.     Historical Provider, MD   BP 138/87 mmHg  Pulse 98  Temp(Src) 98.2 F (36.8 C) (Oral)  Resp 14  Ht 5' (1.524 m)  Wt 140 lb (63.504 kg)  BMI 27.34 kg/m2  SpO2 100% Physical Exam  Constitutional: She is oriented to person, place, and time. She appears well-developed and well-nourished. No distress.  HENT:  Head: Normocephalic and atraumatic.  Eyes: Conjunctivae and EOM are normal. Pupils are equal, round, and reactive to light.  Neck: Normal range of motion. Neck supple. No JVD present.  Cardiovascular: Normal rate, regular rhythm and normal heart  sounds.   No murmur heard. Tachycardiac  Pulmonary/Chest: Effort normal and breath sounds normal. She has no wheezes. She has no rales. She exhibits no tenderness.  Abdominal: Soft. She exhibits no distension and no mass. There is no tenderness.  Bowel sounds decreased   Musculoskeletal: Normal range of motion. She exhibits no edema.  Lymphadenopathy:    She has no cervical adenopathy.  Neurological: She is alert and oriented to person, place, and time. No cranial nerve deficit. She exhibits normal muscle tone. Coordination normal.  Skin: Skin is warm and dry. No rash noted.  Psychiatric: She has a normal mood and affect. Her behavior is normal. Judgment and thought content normal.  Nursing note and vitals reviewed.   ED Course  Procedures (including critical care time) DIAGNOSTIC STUDIES: Oxygen Saturation is 100% on RA, normal by my interpretation.    COORDINATION OF CARE: 2:38 AM- Pt advised of plan for treatment and pt agrees. Pt will receive IV fluids, zofran, and ativan. Pt will also receive D-dimer and explained that if D-dimer is positive, pt will have to be exposed to radiation through imaging.    Labs Review Results for orders placed or performed during the hospital encounter of 01/03/15  CBC with Differential  Result Value Ref Range   WBC 11.9 (H) 4.0 - 10.5 K/uL   RBC 4.70 3.87 - 5.11 MIL/uL   Hemoglobin 14.0 12.0 - 15.0 g/dL   HCT 41.3 36.0 - 46.0 %   MCV 87.9 78.0 - 100.0 fL   MCH 29.8 26.0 - 34.0 pg   MCHC 33.9 30.0 - 36.0 g/dL   RDW 12.8 11.5 - 15.5 %   Platelets 261 150 - 400 K/uL   Neutrophils Relative % 75 %   Neutro Abs 9.0 (H) 1.7 - 7.7 K/uL   Lymphocytes Relative 16 %   Lymphs Abs 1.9 0.7 - 4.0 K/uL   Monocytes Relative 8 %   Monocytes Absolute 0.9 0.1 - 1.0 K/uL   Eosinophils Relative 1 %   Eosinophils Absolute 0.1 0.0 - 0.7 K/uL   Basophils Relative 0 %   Basophils Absolute 0.0 0.0 - 0.1 K/uL  Comprehensive metabolic panel  Result Value Ref Range    Sodium 132 (L) 135 - 145 mmol/L   Potassium 3.4 (L) 3.5 - 5.1 mmol/L   Chloride 101 101 - 111 mmol/L   CO2 25 22 - 32 mmol/L   Glucose, Bld 101 (H) 65 - 99 mg/dL   BUN 16 6 - 20 mg/dL   Creatinine, Ser 0.85 0.44 - 1.00 mg/dL   Calcium 8.5 (L) 8.9 - 10.3 mg/dL   Total Protein 7.2 6.5 - 8.1 g/dL   Albumin 3.5 3.5 - 5.0 g/dL   AST 18 15 - 41 U/L   ALT 17 14 - 54 U/L   Alkaline Phosphatase  64 38 - 126 U/L   Total Bilirubin 1.2 0.3 - 1.2 mg/dL   GFR calc non Af Amer >60 >60 mL/min   GFR calc Af Amer >60 >60 mL/min   Anion gap 6 5 - 15  D-dimer, quantitative  Result Value Ref Range   D-Dimer, Quant 0.30 0.00 - 0.50 ug/mL-FEU  I-Stat CG4 Lactic Acid, ED  Result Value Ref Range   Lactic Acid, Venous 0.93 0.5 - 2.0 mmol/L  I-Stat beta hCG blood, ED  Result Value Ref Range   I-stat hCG, quantitative <5.0 <5 mIU/mL   Comment 3            Delora Fuel, MD has personally reviewed and evaluated these images and lab results as part of his medical decision-making.   EKG Interpretation   Date/Time:  Saturday January 03 2015 01:51:32 EST Ventricular Rate:  94 PR Interval:  161 QRS Duration: 77 QT Interval:  331 QTC Calculation: 414 R Axis:   85 Text Interpretation:  Sinus rhythm Baseline wander in lead(s) I III  Otherwise within normal limits When compared with ECG of 07/16/2010, No  significant change was found Confirmed by [facility]  MD, Eric Nees ([id number]) on  01/03/2015 2:04:59 AM      MDM   Final diagnoses:  Palpitations    Nausea and palpitations of uncertain cause. While I was in the room with the patient, her heart rate did get up as high as 131. It is noted that she had recent long distance car ride so she would be at risk for pulmonary embolism so d-dimer is obtained to rule out pulmonary embolism. She is given IV fluids and screening labs obtained. She's given ondansetron for nausea and also dose of lorazepam as she does seem somewhat anxious. Old records are reviewed and she  has no relevant past visits.  ED workup is unremarkable. D-dimer is normal. Mild hyponatremia has not felt to be clinically significant. She feels somewhat better after above noted treatment. She is discharged with prescription for ondansetron for nausea and she is referred to cardiology for consideration for Holter monitor testing or event monitor testing.   I personally performed the services described in this documentation, which was scribed in my presence. The recorded information has been reviewed and is accurate.      Delora Fuel, MD [id number]

## 2015-01-03 NOTE — ED Notes (Signed)
Pt c/o palpitations, chest pressure, nausea pt states these symptoms have appeared at night for several nights keeping her awake. Pt tearful in triage. Pt states she has taken several preg tests at home, neg result

## 2015-01-08 ENCOUNTER — Ambulatory Visit: Payer: BLUE CROSS/BLUE SHIELD | Admitting: Family Medicine

## 2015-01-15 ENCOUNTER — Ambulatory Visit: Payer: BLUE CROSS/BLUE SHIELD | Admitting: Family Medicine

## 2015-12-15 HISTORY — PX: TRANSTHORACIC ECHOCARDIOGRAM: SHX275

## 2016-03-09 ENCOUNTER — Ambulatory Visit (INDEPENDENT_AMBULATORY_CARE_PROVIDER_SITE_OTHER): Payer: 59 | Admitting: Family Medicine

## 2016-03-09 ENCOUNTER — Encounter: Payer: Self-pay | Admitting: Family Medicine

## 2016-03-09 VITALS — BP 113/76 | HR 87 | Temp 98.6°F | Resp 20 | Wt 154.2 lb

## 2016-03-09 DIAGNOSIS — H6691 Otitis media, unspecified, right ear: Secondary | ICD-10-CM | POA: Diagnosis not present

## 2016-03-09 MED ORDER — AMOXICILLIN-POT CLAVULANATE 875-125 MG PO TABS: 1.0000 | ORAL_TABLET | Freq: Two times a day (BID) | ORAL | 0 refills | Status: DC

## 2016-03-09 NOTE — Progress Notes (Signed)
[name] , January 05, 1981, 36 y.o., female MRN: [id number] Patient Care Team    Relationship Specialty Notifications Start End  Tammi Sou, MD PCP - General Family Medicine  01/03/15     CC: URI symptoms.  Subjective: Pt presents for an acute OV with complaints of ear pain, chest tightness of 1 week duration.  Associated symptoms include cough and mild sore throat. She denies fever, chills, nausea, vomit, diarrhea, headache. She is eating/drinking well. She reports being treated for strep throat about 1 month ago. Her daughter had the flu about 1 month ago. She reports increased pain of her right ear.  Pt has tried advil to ease their symptoms.   Allergies  Allergen Reactions  . Azithromycin Other (See Comments)    Stomach cramps   Social History  Substance Use Topics  . Smoking status: Never Smoker  . Smokeless tobacco: Never Used  . Alcohol use No     Comment: occasional   Past Medical History:  Diagnosis Date  . Cancer ([place])    Thyroid  . Dysrhythmia    seeing cardiologist for tachycardia on meds  . Hypothyroidism    had thyroid cancer  . S/P thyroidectomy 2005   Hx of thyroid cancer  . SVD (spontaneous vaginal delivery) 09/23/2010  . Tachycardia    Past Surgical History:  Procedure Laterality Date  . MOUTH SURGERY    . THYROIDECTOMY  2004   Family History  Problem Relation Age of Onset  . Diabetes Father   . Hypertension Maternal Grandmother   . Asthma Maternal Grandfather   . Diabetes Maternal Grandfather   . Stroke Maternal Grandfather   . Mitral valve prolapse Sister   . Diabetes Paternal Grandmother   . Stroke Paternal Grandmother   . Cancer Maternal Aunt     leaukemia   Allergies as of 03/09/2016      Reactions   Azithromycin Other (See Comments)   Stomach cramps      Medication List       Accurate as of 03/09/16  1:51 PM. Always use your most recent med list.          ARMOUR THYROID 120 MG tablet Generic drug:  thyroid TAKE  1 TABLET EVERY DAY   JUNEL FE 1/20 1-20 MG-MCG tablet Generic drug:  norethindrone-ethinyl estradiol   sertraline 50 MG tablet Commonly known as:  ZOLOFT Take 50 mg by mouth daily.       No results found for this or any previous visit (from the past 24 hour(s)). No results found.   ROS: Negative, with the exception of above mentioned in HPI   Objective:  BP 113/76 (BP Location: Right Arm, Patient Position: Sitting, Cuff Size: Normal)   Pulse 87   Temp 98.6 F (37 C)   Resp 20   Wt 154 lb 4 oz (70 kg)   SpO2 97%   BMI 30.12 kg/m  Body mass index is 30.12 kg/m. Gen: Afebrile. No acute distress. Nontoxic in appearance, well developed, well nourished.  HENT: AT. [place]. Bilateral TM visualized right TM with erythema and bulging. Left TM normal.  MMM, no oral lesions. Bilateral nares WNL. Throat without erythema or exudates. No cough, hoarseness or sinus pressure.  Eyes:Pupils Equal Round Reactive to light, Extraocular movements intact,  Conjunctiva without redness, discharge or icterus. Neck/lymp/endocrine: Supple,no lymphadenopathy CV: RRR Chest: CTAB, no wheeze or crackles. Good air movement, normal resp effort.  Abd: Soft.. NTND. BS present. Skin: no rashes, purpura or  petechiae.  Neuro:  Normal gait. PERLA. EOMi. Alert. Oriented x3  Assessment/Plan: [name] is a 36 y.o. female present for acute OV for  1. Right otitis media, unspecified otitis media type - rest, hydrate. Flonase, mucinex.  - Augmentin. - F/u PRN   electronically signed by:  Howard Pouch, DO  [place]

## 2016-03-09 NOTE — Patient Instructions (Addendum)
Mucinex plain or Mucinex DM for cough/plegm.  flonase daily for a few weeks.  Augmentin called in for you .     Sinusitis, Adult Sinusitis is soreness and inflammation of your sinuses. Sinuses are hollow spaces in the bones around your face. They are located:  Around your eyes.  In the middle of your forehead.  Behind your nose.  In your cheekbones. Your sinuses and nasal passages are lined with a stringy fluid (mucus). Mucus normally drains out of your sinuses. When your nasal tissues get inflamed or swollen, the mucus can get trapped or blocked so air cannot flow through your sinuses. This lets bacteria, viruses, and funguses grow, and that leads to infection. Follow these instructions at home: Medicines  Take, use, or apply over-the-counter and prescription medicines only as told by your doctor. These may include nasal sprays.  If you were prescribed an antibiotic medicine, take it as told by your doctor. Do not stop taking the antibiotic even if you start to feel better. Hydrate and Humidify  Drink enough water to keep your pee (urine) clear or pale yellow.  Use a cool mist humidifier to keep the humidity level in your home above 50%.  Breathe in steam for 10-15 minutes, 3-4 times a day or as told by your doctor. You can do this in the bathroom while a hot shower is running.  Try not to spend time in cool or dry air. Rest  Rest as much as possible.  Sleep with your head raised (elevated).  Make sure to get enough sleep each night. General instructions  Put a warm, moist washcloth on your face 3-4 times a day or as told by your doctor. This will help with discomfort.  Wash your hands often with soap and water. If there is no soap and water, use hand sanitizer.  Do not smoke. Avoid being around people who are smoking (secondhand smoke).  Keep all follow-up visits as told by your doctor. This is important. Contact a doctor if:  You have a fever.  Your symptoms get  worse.  Your symptoms do not get better within 10 days. Get help right away if:  You have a very bad headache.  You cannot stop throwing up (vomiting).  You have pain or swelling around your face or eyes.  You have trouble seeing.  You feel confused.  Your neck is stiff.  You have trouble breathing. This information is not intended to replace advice given to you by your health care provider. Make sure you discuss any questions you have with your health care provider. Document Released: 06/29/2007 Document Revised: 09/06/2015 Document Reviewed: 11/05/2014 Elsevier Interactive Patient Education  2017 [employer].

## 2016-04-11 ENCOUNTER — Ambulatory Visit (INDEPENDENT_AMBULATORY_CARE_PROVIDER_SITE_OTHER): Payer: 59 | Admitting: Family Medicine

## 2016-04-11 ENCOUNTER — Encounter: Payer: Self-pay | Admitting: Family Medicine

## 2016-04-11 VITALS — BP 108/66 | HR 92 | Temp 98.1°F | Resp 20 | Wt 143.5 lb

## 2016-04-11 DIAGNOSIS — J0100 Acute maxillary sinusitis, unspecified: Secondary | ICD-10-CM

## 2016-04-11 DIAGNOSIS — R05 Cough: Secondary | ICD-10-CM

## 2016-04-11 DIAGNOSIS — R059 Cough, unspecified: Secondary | ICD-10-CM

## 2016-04-11 MED ORDER — HYDROCODONE-HOMATROPINE 5-1.5 MG/5ML PO SYRP: 5.0000 mL | ORAL_SOLUTION | Freq: Every evening | ORAL | 0 refills | Status: DC | PRN

## 2016-04-11 MED ORDER — BENZONATATE 200 MG PO CAPS: 200.0000 mg | ORAL_CAPSULE | Freq: Three times a day (TID) | ORAL | 0 refills | Status: DC | PRN

## 2016-04-11 MED ORDER — DOXYCYCLINE HYCLATE 100 MG PO TABS: 100.0000 mg | ORAL_TABLET | Freq: Two times a day (BID) | ORAL | 0 refills | Status: DC

## 2016-04-11 NOTE — Patient Instructions (Signed)
-   Mucinex DM  - Rest, hydrate.  - Prescribed tesslaon perles for daytime cough and Hycodan for night time cough.  - flonase - doxycyline every 12 hours x 10 days.     Sinusitis, Adult Sinusitis is soreness and inflammation of your sinuses. Sinuses are hollow spaces in the bones around your face. They are located:  Around your eyes.  In the middle of your forehead.  Behind your nose.  In your cheekbones. Your sinuses and nasal passages are lined with a stringy fluid (mucus). Mucus normally drains out of your sinuses. When your nasal tissues get inflamed or swollen, the mucus can get trapped or blocked so air cannot flow through your sinuses. This lets bacteria, viruses, and funguses grow, and that leads to infection. Follow these instructions at home: Medicines   Take, use, or apply over-the-counter and prescription medicines only as told by your doctor. These may include nasal sprays.  If you were prescribed an antibiotic medicine, take it as told by your doctor. Do not stop taking the antibiotic even if you start to feel better. Hydrate and Humidify   Drink enough water to keep your pee (urine) clear or pale yellow.  Use a cool mist humidifier to keep the humidity level in your home above 50%.  Breathe in steam for 10-15 minutes, 3-4 times a day or as told by your doctor. You can do this in the bathroom while a hot shower is running.  Try not to spend time in cool or dry air. Rest   Rest as much as possible.  Sleep with your head raised (elevated).  Make sure to get enough sleep each night. General instructions   Put a warm, moist washcloth on your face 3-4 times a day or as told by your doctor. This will help with discomfort.  Wash your hands often with soap and water. If there is no soap and water, use hand sanitizer.  Do not smoke. Avoid being around people who are smoking (secondhand smoke).  Keep all follow-up visits as told by your doctor. This is  important. Contact a doctor if:  You have a fever.  Your symptoms get worse.  Your symptoms do not get better within 10 days. Get help right away if:  You have a very bad headache.  You cannot stop throwing up (vomiting).  You have pain or swelling around your face or eyes.  You have trouble seeing.  You feel confused.  Your neck is stiff.  You have trouble breathing. This information is not intended to replace advice given to you by your health care provider. Make sure you discuss any questions you have with your health care provider. Document Released: 06/29/2007 Document Revised: 09/06/2015 Document Reviewed: 11/05/2014 Elsevier Interactive Patient Education  2017 [employer].

## 2016-04-11 NOTE — Progress Notes (Signed)
[name] , 1980-12-11, 36 y.o., female MRN: [id number] Patient Care Team    Relationship Specialty Notifications Start End  Tammi Sou, MD PCP - General Family Medicine  01/03/15     CC: cough  Subjective: Pt presents for an  OV with complaints of cough of 4 days duration.  Associated symptoms include nasal and chest congestion, fever (102F), burning chest pain, fatigue, headache, sinus pressure. Pt has tried mucinex  to ease their symptoms. She reports her kids have been sick. No h/o asthma. She was able to get over the bronchitis/ear infection last month and then after her kids became sick she got it again.   No flowsheet data found.  Allergies  Allergen Reactions  . Azithromycin Other (See Comments)    Stomach cramps   Social History  Substance Use Topics  . Smoking status: Never Smoker  . Smokeless tobacco: Never Used  . Alcohol use No     Comment: occasional   Past Medical History:  Diagnosis Date  . Cancer ([place])    Thyroid  . Dysrhythmia    seeing cardiologist for tachycardia on meds  . Hypothyroidism    had thyroid cancer  . S/P thyroidectomy 2005   Hx of thyroid cancer  . SVD (spontaneous vaginal delivery) 09/23/2010  . Tachycardia    Past Surgical History:  Procedure Laterality Date  . MOUTH SURGERY    . THYROIDECTOMY  2004   Family History  Problem Relation Age of Onset  . Diabetes Father   . Hypertension Maternal Grandmother   . Asthma Maternal Grandfather   . Diabetes Maternal Grandfather   . Stroke Maternal Grandfather   . Mitral valve prolapse Sister   . Diabetes Paternal Grandmother   . Stroke Paternal Grandmother   . Cancer Maternal Aunt     leaukemia   Allergies as of 04/11/2016      Reactions   Azithromycin Other (See Comments)   Stomach cramps      Medication List       Accurate as of 04/11/16 11:42 AM. Always use your most recent med list.          ARMOUR THYROID 120 MG tablet Generic drug:  thyroid TAKE 1  TABLET EVERY DAY   ARMOUR THYROID 30 MG tablet Generic drug:  thyroid TAKE 1 TABLET ALONG WITH 120MG  ONCE A DAY ORALLY 30 DAYS   JUNEL FE 1/20 1-20 MG-MCG tablet Generic drug:  norethindrone-ethinyl estradiol   sertraline 50 MG tablet Commonly known as:  ZOLOFT Take 50 mg by mouth daily.       No results found for this or any previous visit (from the past 24 hour(s)). No results found.   ROS: Negative, with the exception of above mentioned in HPI   Objective:  BP 108/66 (BP Location: Left Arm, Patient Position: Sitting, Cuff Size: Normal)   Pulse 92   Temp 98.1 F (36.7 C)   Resp 20   Wt 143 lb 8 oz (65.1 kg)   SpO2 97%   BMI 28.03 kg/m  Body mass index is 28.03 kg/m. Gen: Afebrile. No acute distress. Nontoxic in appearance, well developed, well nourished. Harsh cough.  HENT: AT. [place]. Bilateral TM visualized with fulness bilateral and small amount of dried blood right. MMM, no oral lesions. Bilateral nares with erythema, drainage, mild swelling. Throat without erythema or exudates. Cough, hoarseness, TTP max and frontal sinus.  Eyes:Pupils Equal Round Reactive to light, Extraocular movements intact,  Conjunctiva without redness, discharge  or icterus. Neck/lymp/endocrine: Supple, no  lymphadenopathy CV: RRR  Chest: CTAB, no wheeze or crackles. Good air movement, normal resp effort.  Abd: Soft. NTND. BS present. no Masses palpated. No rebound or guarding.  Skin: no rashes, purpura or petechiae.  Neuro:  Normal gait. PERLA. EOMi. Alert. Oriented x3   Assessment/Plan: [name] is a 36 y.o. female present for  OV for  Cough - benzonatate (TESSALON) 200 MG capsule; Take 1 capsule (200 mg total) by mouth 3 (three) times daily as needed for cough.  Dispense: 20 capsule; Refill: 0 - Hycodan syrup, no refills.  Acute maxillary sinusitis, recurrence not specified - doxycycline (VIBRA-TABS) 100 MG tablet; Take 1 tablet (100 mg total) by mouth 2 (two) times daily.   Dispense: 20 tablet; Refill: 0 - Mucinex DM  - Rest, hydrate.  - Prescribed tessalon perles for daytime cough and Hycodan for night time cough.  - flonase, allegra - doxycyline BID x 10 days.  Reviewed expectations re: course of current medical issues.  Discussed self-management of symptoms.  Outlined signs and symptoms indicating need for more acute intervention.  Patient verbalized understanding and all questions were answered.  Patient received an After-Visit Summary.   electronically signed by:  Howard Pouch, DO  [place]

## 2016-06-10 LAB — HM PAP SMEAR: HM PAP: NEGATIVE

## 2016-06-16 ENCOUNTER — Encounter: Payer: Self-pay | Admitting: Family Medicine

## 2017-06-09 ENCOUNTER — Encounter (HOSPITAL_BASED_OUTPATIENT_CLINIC_OR_DEPARTMENT_OTHER): Payer: Self-pay | Admitting: *Deleted

## 2017-06-13 ENCOUNTER — Encounter (HOSPITAL_BASED_OUTPATIENT_CLINIC_OR_DEPARTMENT_OTHER): Payer: Self-pay | Admitting: *Deleted

## 2017-06-13 ENCOUNTER — Other Ambulatory Visit: Payer: Self-pay

## 2017-06-13 NOTE — Progress Notes (Signed)
SPOKE W/ PT VIA PHONE FOR PRE-OP INTERVIEW.  NPO AFTER MN.  ARRIVE AT 0530.  NEEDS URINE PREG.  GETTING CBC DONE Wednesday 06-14-2017 @ 0900.  WILL TAKE AM MEDS W/ SIPS OF WATER DOS.

## 2017-06-14 ENCOUNTER — Encounter (HOSPITAL_COMMUNITY)
Admission: RE | Admit: 2017-06-14 | Discharge: 2017-06-14 | Disposition: A | Discharge status: Home / Self Care | Payer: 59 | Source: Ambulatory Visit | Attending: Obstetrics and Gynecology | Admitting: Obstetrics and Gynecology

## 2017-06-14 DIAGNOSIS — Z79899 Other long term (current) drug therapy: Secondary | ICD-10-CM | POA: Diagnosis not present

## 2017-06-14 DIAGNOSIS — N939 Abnormal uterine and vaginal bleeding, unspecified: Secondary | ICD-10-CM | POA: Diagnosis present

## 2017-06-14 DIAGNOSIS — E890 Postprocedural hypothyroidism: Secondary | ICD-10-CM | POA: Diagnosis not present

## 2017-06-14 DIAGNOSIS — Z881 Allergy status to other antibiotic agents status: Secondary | ICD-10-CM | POA: Diagnosis not present

## 2017-06-14 DIAGNOSIS — Z85850 Personal history of malignant neoplasm of thyroid: Secondary | ICD-10-CM | POA: Diagnosis not present

## 2017-06-14 DIAGNOSIS — D250 Submucous leiomyoma of uterus: Secondary | ICD-10-CM | POA: Diagnosis not present

## 2017-06-14 LAB — CBC
HCT: 40.1 % (ref 36.0–46.0)
HEMOGLOBIN: 13.4 g/dL (ref 12.0–15.0)
MCH: 28.7 pg (ref 26.0–34.0)
MCHC: 33.4 g/dL (ref 30.0–36.0)
MCV: 85.9 fL (ref 78.0–100.0)
PLATELETS: 330 10*3/uL (ref 150–400)
RBC: 4.67 MIL/uL (ref 3.87–5.11)
RDW: 12.9 % (ref 11.5–15.5)
WBC: 7.1 10*3/uL (ref 4.0–10.5)

## 2017-06-14 NOTE — H&P (Signed)
[name] is an 37 y.o. female G2P2 who presents for scheduled operative hysteroscopy and resection of submucosal fibroid.   Pt had episode of prolonged bleeding in March that lasted 14 days on OCP's, switched to higher strength OCP, but next cycle was 2 weeks early. She had a subsequent workup that revealed a submucosal fibroid.  No significant cramping or pain. Her husband recently had a vasectomy and still needs to clear. She is on her increased dose of OCP's x 1 month and has not bled recently.  Pertinent Gynecological History: OB History: NSVD x 2   Menstrual History:  Patient's last menstrual period was 05/10/2017 (approximate).    Past Medical History:  Diagnosis Date  . Abnormal uterine bleeding (AUB)   . History of papillary adenocarcinoma of thyroid last imaging in epic 02-18-2013 no recurrence   01-20-2003  s/p partial thyroidectomy for isthmysectomy for nodule:  03-07-2003  s/p  total thyroidecotmy for cancer  . Hypothyroidism, postsurgical    endocrinoloigst-  dr balan--  hx papillary carcinoma s/p  totoal thyroidectomy 03-07-2003  . Submucous myoma of uterus   . Tachycardia    06-13-2017  per pt heartrate 90s  . Wears contact lenses     Past Surgical History:  Procedure Laterality Date  . THYROIDECTOMY, PARTIAL  01-20-2003    dr Hassell Done [facility]   isthmusectomy for thyroid nodule  . TOTAL THYROIDECTOMY  03-07-2003   dr Hassell Done  [facility]   papillary carcinoma  . TRANSTHORACIC ECHOCARDIOGRAM  12/15/2015   ef  60%    Family History  Problem Relation Age of Onset  . Diabetes Father   . Hypertension Maternal Grandmother   . Asthma Maternal Grandfather   . Diabetes Maternal Grandfather   . Stroke Maternal Grandfather   . Mitral valve prolapse Sister   . Diabetes Paternal Grandmother   . Stroke Paternal Grandmother   . Cancer Maternal Aunt        leaukemia    Social History:  reports that she has never smoked. She has never used smokeless tobacco. She reports that  she drinks alcohol. She reports that she does not use drugs.  Allergies:  Allergies  Allergen Reactions  . Azithromycin Other (See Comments)    Stomach cramps    No medications prior to admission.    Review of Systems  Gastrointestinal: Negative for abdominal pain.  Genitourinary: Negative for dysuria.    Height 5' (1.524 m), weight 145 lb (65.8 kg), last menstrual period 05/10/2017. Physical Exam  Constitutional: She appears well-developed and well-nourished.  Cardiovascular: Normal rate and regular rhythm.  Respiratory: Effort normal.  GI: Soft.  Genitourinary: Vagina normal.  Neurological: She is alert.  Psychiatric: She has a normal mood and affect.    Results for orders placed or performed during the hospital encounter of 06/15/17 (from the past 24 hour(s))  CBC     Status: None   Collection Time: 06/14/17  9:08 AM  Result Value Ref Range   WBC 7.1 4.0 - 10.5 K/uL   RBC 4.67 3.87 - 5.11 MIL/uL   Hemoglobin 13.4 12.0 - 15.0 g/dL   HCT 40.1 36.0 - 46.0 %   MCV 85.9 78.0 - 100.0 fL   MCH 28.7 26.0 - 34.0 pg   MCHC 33.4 30.0 - 36.0 g/dL   RDW 12.9 11.5 - 15.5 %   Platelets 330 150 - 400 K/uL    No results found.  Assessment/Plan: The patient was counseled regarding the hysteroscopy procedure in detail. We  reviewed risks of bleeding and infection and possible uterine perforation. We also discussed the removal of any identified polyps or submucosal fibroids and what that would involve. The patient desires to proceed. She will use cytotec 425mcg 3 hours prior to the procedure to aid with cervical dilation. She will stay on her OCP's as scheduled until husband's vasectomy clears, then may want to d/c and see how it goes.   Logan Bores 06/14/2017, 7:54 PM

## 2017-06-14 NOTE — Anesthesia Preprocedure Evaluation (Addendum)
Anesthesia Evaluation  Patient identified by MRN, date of birth, ID band Patient awake    Reviewed: Allergy & Precautions, NPO status , Patient's Chart, lab work & pertinent test results  Airway Mallampati: II  TM Distance: >3 FB Neck ROM: Full    Dental no notable dental hx. (+) Dental Advisory Given, Teeth Intact   Pulmonary neg pulmonary ROS,    Pulmonary exam normal breath sounds clear to auscultation       Cardiovascular Exercise Tolerance: Good negative cardio ROS Normal cardiovascular exam Rhythm:Regular Rate:Normal     Neuro/Psych negative neurological ROS     GI/Hepatic Neg liver ROS,   Endo/Other  Hypothyroidism   Renal/GU negative Renal ROS     Musculoskeletal negative musculoskeletal ROS (+)   Abdominal   Peds  Hematology negative hematology ROS (+)   Anesthesia Other Findings   Reproductive/Obstetrics negative OB ROS                             Lab Results  Component Value Date   WBC 7.1 06/14/2017   HGB 13.4 06/14/2017   HCT 40.1 06/14/2017   MCV 85.9 06/14/2017   PLT 330 06/14/2017    Anesthesia Physical Anesthesia Plan  ASA: I  Anesthesia Plan: General   Post-op Pain Management:    Induction: Intravenous  PONV Risk Score and Plan: Treatment may vary due to age or medical condition  Airway Management Planned: LMA  Additional Equipment:   Intra-op Plan:   Post-operative Plan:   Informed Consent: I have reviewed the patients History and Physical, chart, labs and discussed the procedure including the risks, benefits and alternatives for the proposed anesthesia with the patient or authorized representative who has indicated his/her understanding and acceptance.     Plan Discussed with: CRNA  Anesthesia Plan Comments:         Anesthesia Quick Evaluation

## 2017-06-15 ENCOUNTER — Ambulatory Visit (HOSPITAL_BASED_OUTPATIENT_CLINIC_OR_DEPARTMENT_OTHER): Payer: 59 | Admitting: Anesthesiology

## 2017-06-15 ENCOUNTER — Encounter (HOSPITAL_BASED_OUTPATIENT_CLINIC_OR_DEPARTMENT_OTHER): Payer: Self-pay | Admitting: *Deleted

## 2017-06-15 ENCOUNTER — Ambulatory Visit (HOSPITAL_BASED_OUTPATIENT_CLINIC_OR_DEPARTMENT_OTHER)
Admission: RE | Admit: 2017-06-15 | Discharge: 2017-06-15 | Disposition: A | Discharge status: Home / Self Care | Payer: 59 | Source: Ambulatory Visit | Attending: Obstetrics and Gynecology | Admitting: Obstetrics and Gynecology

## 2017-06-15 ENCOUNTER — Other Ambulatory Visit: Payer: Self-pay

## 2017-06-15 ENCOUNTER — Encounter (HOSPITAL_BASED_OUTPATIENT_CLINIC_OR_DEPARTMENT_OTHER)
Admission: RE | Disposition: A | Discharge status: Home / Self Care | Payer: Self-pay | Source: Ambulatory Visit | Attending: Obstetrics and Gynecology

## 2017-06-15 DIAGNOSIS — E890 Postprocedural hypothyroidism: Secondary | ICD-10-CM | POA: Insufficient documentation

## 2017-06-15 DIAGNOSIS — N939 Abnormal uterine and vaginal bleeding, unspecified: Secondary | ICD-10-CM | POA: Insufficient documentation

## 2017-06-15 DIAGNOSIS — Z79899 Other long term (current) drug therapy: Secondary | ICD-10-CM | POA: Insufficient documentation

## 2017-06-15 DIAGNOSIS — Z881 Allergy status to other antibiotic agents status: Secondary | ICD-10-CM | POA: Insufficient documentation

## 2017-06-15 DIAGNOSIS — D250 Submucous leiomyoma of uterus: Secondary | ICD-10-CM | POA: Diagnosis not present

## 2017-06-15 DIAGNOSIS — Z85850 Personal history of malignant neoplasm of thyroid: Secondary | ICD-10-CM | POA: Insufficient documentation

## 2017-06-15 HISTORY — DX: Presence of spectacles and contact lenses: Z97.3

## 2017-06-15 HISTORY — DX: Submucous leiomyoma of uterus: D25.0

## 2017-06-15 HISTORY — PX: HYSTEROSCOPY: SHX211

## 2017-06-15 HISTORY — DX: Postprocedural hypothyroidism: E89.0

## 2017-06-15 HISTORY — PX: OTHER SURGICAL HISTORY: SHX169

## 2017-06-15 HISTORY — PX: DILATATION & CURETTAGE/HYSTEROSCOPY WITH MYOSURE: SHX6511

## 2017-06-15 HISTORY — DX: Personal history of malignant neoplasm of thyroid: Z85.850

## 2017-06-15 HISTORY — DX: Abnormal uterine and vaginal bleeding, unspecified: N93.9

## 2017-06-15 LAB — POCT PREGNANCY, URINE: Preg Test, Ur: NEGATIVE

## 2017-06-15 SURGERY — DILATATION & CURETTAGE/HYSTEROSCOPY WITH MYOSURE
Anesthesia: General

## 2017-06-15 MED ORDER — ONDANSETRON HCL 4 MG/2ML IJ SOLN
INTRAMUSCULAR | Status: AC
  Filled 2017-06-15: qty 2

## 2017-06-15 MED ORDER — KETOROLAC TROMETHAMINE 30 MG/ML IJ SOLN
INTRAMUSCULAR | Status: DC | PRN
  Administered 2017-06-15: 30 mg via INTRAVENOUS

## 2017-06-15 MED ORDER — PROPOFOL 10 MG/ML IV BOLUS
INTRAVENOUS | Status: AC
  Filled 2017-06-15: qty 40

## 2017-06-15 MED ORDER — FENTANYL CITRATE (PF) 100 MCG/2ML IJ SOLN
INTRAMUSCULAR | Status: DC | PRN
  Administered 2017-06-15: 50 ug via INTRAVENOUS

## 2017-06-15 MED ORDER — LACTATED RINGERS IV SOLN
INTRAVENOUS | Status: DC
  Administered 2017-06-15: 06:00:00 via INTRAVENOUS
  Filled 2017-06-15: qty 1000

## 2017-06-15 MED ORDER — DEXAMETHASONE SODIUM PHOSPHATE 10 MG/ML IJ SOLN
INTRAMUSCULAR | Status: AC
  Filled 2017-06-15: qty 1

## 2017-06-15 MED ORDER — PROPOFOL 10 MG/ML IV BOLUS
INTRAVENOUS | Status: DC | PRN
  Administered 2017-06-15: 180 mg via INTRAVENOUS
  Administered 2017-06-15: 20 mg via INTRAVENOUS

## 2017-06-15 MED ORDER — ONDANSETRON HCL 4 MG/2ML IJ SOLN
INTRAMUSCULAR | Status: DC | PRN
  Administered 2017-06-15: 4 mg via INTRAVENOUS

## 2017-06-15 MED ORDER — SCOPOLAMINE 1 MG/3DAYS TD PT72
1.0000 | MEDICATED_PATCH | TRANSDERMAL | Status: DC
  Administered 2017-06-15: 1.5 mg via TRANSDERMAL
  Filled 2017-06-15: qty 1

## 2017-06-15 MED ORDER — PROMETHAZINE HCL 25 MG/ML IJ SOLN
6.2500 mg | INTRAMUSCULAR | Status: DC | PRN
  Filled 2017-06-15: qty 1

## 2017-06-15 MED ORDER — DEXAMETHASONE SODIUM PHOSPHATE 10 MG/ML IJ SOLN
INTRAMUSCULAR | Status: DC | PRN
  Administered 2017-06-15: 10 mg via INTRAVENOUS

## 2017-06-15 MED ORDER — PHENYLEPHRINE 40 MCG/ML (10ML) SYRINGE FOR IV PUSH (FOR BLOOD PRESSURE SUPPORT)
PREFILLED_SYRINGE | INTRAVENOUS | Status: AC
  Filled 2017-06-15: qty 10

## 2017-06-15 MED ORDER — ACETAMINOPHEN 10 MG/ML IV SOLN
1000.0000 mg | Freq: Once | INTRAVENOUS | Status: DC | PRN
  Filled 2017-06-15: qty 100

## 2017-06-15 MED ORDER — GABAPENTIN 300 MG PO CAPS
ORAL_CAPSULE | ORAL | Status: AC
  Filled 2017-06-15: qty 1

## 2017-06-15 MED ORDER — GABAPENTIN 300 MG PO CAPS
300.0000 mg | ORAL_CAPSULE | Freq: Once | ORAL | Status: AC
  Administered 2017-06-15: 300 mg via ORAL
  Filled 2017-06-15: qty 1

## 2017-06-15 MED ORDER — HYDROMORPHONE HCL 1 MG/ML IJ SOLN
0.2500 mg | INTRAMUSCULAR | Status: DC | PRN
  Filled 2017-06-15: qty 0.5

## 2017-06-15 MED ORDER — ACETAMINOPHEN 500 MG PO TABS
ORAL_TABLET | ORAL | Status: AC
  Filled 2017-06-15: qty 2

## 2017-06-15 MED ORDER — LIDOCAINE HCL 1 % IJ SOLN
INTRAMUSCULAR | Status: DC | PRN
  Administered 2017-06-15: 20 mL

## 2017-06-15 MED ORDER — ACETAMINOPHEN 500 MG PO TABS
1000.0000 mg | ORAL_TABLET | Freq: Once | ORAL | Status: AC
  Administered 2017-06-15: 1000 mg via ORAL
  Filled 2017-06-15: qty 2

## 2017-06-15 MED ORDER — IBUPROFEN 200 MG PO TABS: 600.0000 mg | ORAL_TABLET | Freq: Four times a day (QID) | ORAL | 0 refills | Status: AC | PRN

## 2017-06-15 MED ORDER — MIDAZOLAM HCL 5 MG/5ML IJ SOLN
INTRAMUSCULAR | Status: DC | PRN
  Administered 2017-06-15: 2 mg via INTRAVENOUS

## 2017-06-15 MED ORDER — KETOROLAC TROMETHAMINE 30 MG/ML IJ SOLN
INTRAMUSCULAR | Status: AC
  Filled 2017-06-15: qty 1

## 2017-06-15 MED ORDER — HYDROCODONE-ACETAMINOPHEN 7.5-325 MG PO TABS
1.0000 | ORAL_TABLET | Freq: Once | ORAL | Status: DC | PRN
  Filled 2017-06-15: qty 1

## 2017-06-15 MED ORDER — PHENYLEPHRINE 40 MCG/ML (10ML) SYRINGE FOR IV PUSH (FOR BLOOD PRESSURE SUPPORT)
PREFILLED_SYRINGE | INTRAVENOUS | Status: DC | PRN
  Administered 2017-06-15: 80 ug via INTRAVENOUS
  Administered 2017-06-15: 40 ug via INTRAVENOUS
  Administered 2017-06-15 (×2): 80 ug via INTRAVENOUS

## 2017-06-15 MED ORDER — MIDAZOLAM HCL 2 MG/2ML IJ SOLN
INTRAMUSCULAR | Status: AC
  Filled 2017-06-15: qty 2

## 2017-06-15 MED ORDER — LIDOCAINE 2% (20 MG/ML) 5 ML SYRINGE
INTRAMUSCULAR | Status: DC | PRN
  Administered 2017-06-15: 100 mg via INTRAVENOUS

## 2017-06-15 MED ORDER — LIDOCAINE 2% (20 MG/ML) 5 ML SYRINGE
INTRAMUSCULAR | Status: AC
  Filled 2017-06-15: qty 5

## 2017-06-15 MED ORDER — LACTATED RINGERS IV SOLN
INTRAVENOUS | Status: DC
  Filled 2017-06-15: qty 1000

## 2017-06-15 MED ORDER — FENTANYL CITRATE (PF) 100 MCG/2ML IJ SOLN
INTRAMUSCULAR | Status: AC
  Filled 2017-06-15: qty 2

## 2017-06-15 MED ORDER — MEPERIDINE HCL 25 MG/ML IJ SOLN
6.2500 mg | INTRAMUSCULAR | Status: DC | PRN
  Filled 2017-06-15: qty 1

## 2017-06-15 SURGICAL SUPPLY — 21 items
CANISTER SUCT 3000ML PPV (MISCELLANEOUS) ×6 IMPLANT
CATH ROBINSON RED A/P 16FR (CATHETERS) ×3 IMPLANT
DEVICE MYOSURE LITE (MISCELLANEOUS) IMPLANT
DEVICE MYOSURE REACH (MISCELLANEOUS) ×3 IMPLANT
FILTER ARTHROSCOPY CONVERTOR (FILTER) ×3 IMPLANT
GLOVE BIO SURGEON STRL SZ7 (GLOVE) ×3 IMPLANT
GLOVE BIOGEL PI IND STRL 7.0 (GLOVE) ×1 IMPLANT
GLOVE BIOGEL PI INDICATOR 7.0 (GLOVE) ×2
GOWN STRL REUS W/ TWL LRG LVL3 (GOWN DISPOSABLE) ×1 IMPLANT
GOWN STRL REUS W/TWL LRG LVL3 (GOWN DISPOSABLE) ×3
IV NS IRRIG 3000ML ARTHROMATIC (IV SOLUTION) ×3 IMPLANT
KIT TURNOVER CYSTO (KITS) ×3 IMPLANT
MYOSURE XL FIBROID REM (MISCELLANEOUS)
NEEDLE SPNL 22GX3.5 QUINCKE BK (NEEDLE) ×3 IMPLANT
PACK VAGINAL MINOR WOMEN LF (CUSTOM PROCEDURE TRAY) ×3 IMPLANT
PAD OB MATERNITY 4.3X12.25 (PERSONAL CARE ITEMS) ×3 IMPLANT
SEAL ROD LENS SCOPE MYOSURE (ABLATOR) ×3 IMPLANT
SYSTEM TISS REMOVAL MYSR XL RM (MISCELLANEOUS) IMPLANT
TOWEL OR 17X24 6PK STRL BLUE (TOWEL DISPOSABLE) ×6 IMPLANT
TUBING AQUILEX INFLOW (TUBING) ×3 IMPLANT
TUBING AQUILEX OUTFLOW (TUBING) ×3 IMPLANT

## 2017-06-15 NOTE — Op Note (Signed)
Operative Note    Preoperative Diagnosis Abnormal Uterine Bleeding Submucosal fibroid  Postoperative Diagnosis same   Procedure Hysteroscopy with resection of submucosal fibroid using Myosure  Surgeon Paula Compton, MD  Anesthesia GETA  Fluids: EBL 47mL UOP 1mL straight cath prior IVF 812mL LR  Findings The uterine cavity had a 3cm submucosal fibroid extending into the cavity from the anterior fundal wall. The remainder of the cavity appeared atrophic  Specimen Fragments of leiomyoma  Procedure Note Patient was taken to the operating room where LMA anesthesia was obtained without difficulty. She was then prepped and draped in the normal sterile fashion in the dorsal lithotomy position. An appropriate time out was performed. A speculum was then placed within the vagina and the anterior lip of the cervix identified and injected with approximately 2 cc of 1% plain lidocaine. An additional 9 cc each was placed at 2 and 10:00 for a paracervical block. Uterus was then sounded to approximately 8 cm and the Pratt dilators utilized to dilate the cervix up to approximately 24. The Myosure operating scope was then introduced into the cervix and the cavity inspected with findings as previously noted. The Myosure REACH operating blade was then introduced through the scope and under direct visualization the fibroid was shaved down to the level of the myometrium. A small vessel was oozing at the fibroid base at completion, but no brisk bleeding noted.  The operating scope was then removed from the cervix. All tissue specimens were handed off to pathology. The tenaculum was then removed from the cervix and the site rendered hemostatic with silver nitrate. Finally the speculum was removed from the vagina and the patient awakened and taken to the recovery room in good condition.

## 2017-06-15 NOTE — Anesthesia Procedure Notes (Signed)
Procedure Name: LMA Insertion Date/Time: 06/15/2017 7:23 AM Performed by: Bonney Aid, CRNA Pre-anesthesia Checklist: Patient identified, Emergency Drugs available, Suction available and Patient being monitored Patient Re-evaluated:Patient Re-evaluated prior to induction Oxygen Delivery Method: Circle system utilized Preoxygenation: Pre-oxygenation with 100% oxygen Induction Type: IV induction Ventilation: Mask ventilation without difficulty LMA: LMA inserted LMA Size: 4.0 Number of attempts: 1 Airway Equipment and Method: Bite block Placement Confirmation: positive ETCO2 Tube secured with: Tape Dental Injury: Teeth and Oropharynx as per pre-operative assessment

## 2017-06-15 NOTE — Progress Notes (Signed)
Patient ID: [name], female   DOB: [birth year], 38 y.o.   MRN: [id number] Per pt no changes in dictated H&P.  Brief exam WNL.  Ready to proceed.

## 2017-06-15 NOTE — Transfer of Care (Signed)
Immediate Anesthesia Transfer of Care Note  Patient: [name]  Procedure(s) Performed: DILATATION & CURETTAGE/HYSTEROSCOPY WITH MYOSURE (N/A )  Patient Location: PACU  Anesthesia Type:General  Level of Consciousness: awake, alert  and oriented  Airway & Oxygen Therapy: Patient Spontanous Breathing and Patient connected to nasal cannula oxygen  Post-op Assessment: Report given to RN  Post vital signs: Reviewed and stable  Last Vitals:  Vitals Value Taken Time  BP 113/72 06/15/2017  8:01 AM  Temp 36.9 C 06/15/2017  8:02 AM  Pulse 125 06/15/2017  8:03 AM  Resp 18 06/15/2017  8:03 AM  SpO2 97 % 06/15/2017  8:03 AM  Vitals shown include unvalidated device data.  Last Pain:  Vitals:   06/15/17 0558  TempSrc:   PainSc: 0-No pain      Patients Stated Pain Goal: 7 (50/56/97 9480)  Complications: No apparent anesthesia complications

## 2017-06-15 NOTE — Anesthesia Postprocedure Evaluation (Signed)
Anesthesia Post Note  Patient: [name]  Procedure(s) Performed: DILATATION & CURETTAGE/HYSTEROSCOPY WITH MYOSURE (N/A )     Patient location during evaluation: PACU Anesthesia Type: General Level of consciousness: awake and alert Pain management: pain level controlled Vital Signs Assessment: post-procedure vital signs reviewed and stable Respiratory status: spontaneous breathing, nonlabored ventilation, respiratory function stable and patient connected to nasal cannula oxygen Cardiovascular status: blood pressure returned to baseline and stable Postop Assessment: no apparent nausea or vomiting Anesthetic complications: no    Last Vitals:  Vitals:   06/15/17 0840 06/15/17 0910  BP: 116/76 105/62  Pulse: (!) 109 99  Resp: 18 16  Temp:  37.2 C  SpO2: 96% 97%    Last Pain:  Vitals:   06/15/17 0910  TempSrc: Axillary  PainSc: 0-No pain                 Barnet Glasgow

## 2017-06-15 NOTE — Discharge Instructions (Signed)
° ° ° ° °  Post Anesthesia Home Care Instructions  Activity: Get plenty of rest for the remainder of the day. A responsible individual must stay with you for 24 hours following the procedure.  For the next 24 hours, DO NOT: -Drive a car -Paediatric nurse -Drink alcoholic beverages -Take any medication unless instructed by your physician -Make any legal decisions or sign important papers.  Meals: Start with liquid foods such as gelatin or soup. Progress to regular foods as tolerated. Avoid greasy, spicy, heavy foods. If nausea and/or vomiting occur, drink only clear liquids until the nausea and/or vomiting subsides. Call your physician if vomiting continues.  Special Instructions/Symptoms: Your throat may feel dry or sore from the anesthesia or the breathing tube placed in your throat during surgery. If this causes discomfort, gargle with warm salt water. The discomfort should disappear within 24 hours.  If you had a scopolamine patch placed behind your ear for the management of post- operative nausea and/or vomiting:  1. The medication in the patch is effective for 72 hours, after which it should be removed.  Wrap patch in a tissue and discard in the trash. Wash hands thoroughly with soap and water. 2. You may remove the patch earlier than 72 hours if you experience unpleasant side effects which may include dry mouth, dizziness or visual disturbances. 3. Avoid touching the patch. Wash your hands with soap and water after contact with the patch.  Do not take any nonsteroidal anti inflammatories (Ibuprofen, advil, Motrin, Aleve) until after 1:30 pm today.

## 2017-06-16 ENCOUNTER — Encounter (HOSPITAL_BASED_OUTPATIENT_CLINIC_OR_DEPARTMENT_OTHER): Payer: Self-pay | Admitting: Obstetrics and Gynecology

## 2017-06-16 HISTORY — PX: OTHER SURGICAL HISTORY: SHX169

## 2017-06-20 ENCOUNTER — Encounter: Payer: Self-pay | Admitting: Family Medicine

## 2017-06-30 ENCOUNTER — Encounter: Payer: Self-pay | Admitting: *Deleted

## 2018-02-26 ENCOUNTER — Other Ambulatory Visit: Payer: Self-pay | Admitting: Cardiology

## 2018-02-26 DIAGNOSIS — Z8342 Family history of familial hypercholesterolemia: Secondary | ICD-10-CM

## 2018-02-26 DIAGNOSIS — R000 Tachycardia, unspecified: Secondary | ICD-10-CM

## 2018-02-28 ENCOUNTER — Telehealth: Payer: Self-pay

## 2018-03-26 ENCOUNTER — Telehealth: Payer: Self-pay

## 2018-03-26 DIAGNOSIS — R000 Tachycardia, unspecified: Secondary | ICD-10-CM

## 2018-03-26 DIAGNOSIS — Z8342 Family history of familial hypercholesterolemia: Secondary | ICD-10-CM

## 2018-04-16 ENCOUNTER — Ambulatory Visit: Payer: Self-pay | Admitting: Cardiology

## 2018-05-21 ENCOUNTER — Telehealth: Payer: Self-pay

## 2018-05-25 NOTE — Telephone Encounter (Signed)
Just FYI................Marland Kitchenpatient "no-showed" for her gxt. She did cancel her f/u appt w/you on March 23, saying she was feeling better and doesn't feel she needs the f/u appt.  Just FYI................Marland Kitchenpatient "no-showed" for her gxt. She did cancel her f/u appt w/you on March 23, saying she was feeling better and doesn't feel she needs the f/u appt.

## 2019-03-23 ENCOUNTER — Ambulatory Visit: Payer: 59 | Attending: Internal Medicine

## 2019-03-23 DIAGNOSIS — Z23 Encounter for immunization: Secondary | ICD-10-CM | POA: Insufficient documentation

## 2019-03-23 NOTE — Progress Notes (Signed)
   Covid-19 Vaccination Clinic  Name:  [name]    MRN: [id number] DOB: February 22, 1980  03/23/2019  [name] was observed post Covid-19 immunization for 15 minutes without incidence. She was provided with Vaccine Information Sheet and instruction to access the V-Safe system.   [name] was instructed to call 911 with any severe reactions post vaccine: Marland Kitchen Difficulty breathing  . Swelling of your face and throat  . A fast heartbeat  . A bad rash all over your body  . Dizziness and weakness    Immunizations Administered    Name Date Dose VIS Date Route   Pfizer COVID-19 Vaccine 03/23/2019  3:31 PM 0.3 mL 01/04/2019 Intramuscular   Manufacturer: [place]   Lot: [id number]   [place]: [id number]

## 2019-04-13 ENCOUNTER — Ambulatory Visit: Payer: 59 | Attending: Internal Medicine

## 2019-04-13 DIAGNOSIS — Z23 Encounter for immunization: Secondary | ICD-10-CM

## 2019-04-13 NOTE — Progress Notes (Signed)
   Covid-19 Vaccination Clinic  Name:  [name]    MRN: [id number] DOB: 1980/09/24  04/13/2019  [name] was observed post Covid-19 immunization for 15 minutes without incident. She was provided with Vaccine Information Sheet and instruction to access the V-Safe system.   [name] was instructed to call 911 with any severe reactions post vaccine: Marland Kitchen Difficulty breathing  . Swelling of face and throat  . A fast heartbeat  . A bad rash all over body  . Dizziness and weakness   Immunizations Administered    Name Date Dose VIS Date Route   Pfizer COVID-19 Vaccine 04/13/2019  8:52 AM 0.3 mL 01/04/2019 Intramuscular   Manufacturer: [place]   Lot: [id number]   [place]: [id number]

## 2022-09-21 ENCOUNTER — Encounter (HOSPITAL_BASED_OUTPATIENT_CLINIC_OR_DEPARTMENT_OTHER): Payer: Self-pay | Admitting: Obstetrics and Gynecology

## 2022-09-23 ENCOUNTER — Other Ambulatory Visit: Payer: Self-pay

## 2022-09-23 ENCOUNTER — Encounter (HOSPITAL_BASED_OUTPATIENT_CLINIC_OR_DEPARTMENT_OTHER): Payer: Self-pay | Admitting: Obstetrics and Gynecology

## 2022-09-23 DIAGNOSIS — Z01812 Encounter for preprocedural laboratory examination: Secondary | ICD-10-CM | POA: Diagnosis present

## 2022-09-23 NOTE — Progress Notes (Signed)
Your procedure is scheduled on Monday, 10/10/22.  Report to [facility] [place] AT  5:30 AM.   Call this number if you have problems the morning of surgery  [phone].   OUR ADDRESS IS [address].  WE ARE LOCATED IN [facility].  PLEASE BRING YOUR INSURANCE CARD AND PHOTO ID DAY OF SURGERY.  ONLY 2 PEOPLE ARE ALLOWED IN  WAITING  ROOM                                      REMEMBER:  DO NOT EAT FOOD, CANDY GUM OR MINTS  AFTER MIDNIGHT THE NIGHT BEFORE YOUR SURGERY . YOU MAY HAVE CLEAR LIQUIDS FROM MIDNIGHT THE NIGHT BEFORE YOUR SURGERY UNTIL  4:30 AM. NO CLEAR LIQUIDS AFTER   4:30 AM DAY OF SURGERY.  YOU MAY  BRUSH YOUR TEETH MORNING OF SURGERY AND RINSE YOUR MOUTH OUT, NO CHEWING GUM CANDY OR MINTS.     CLEAR LIQUID DIET    Allowed      Water                                                                   Coffee and tea, regular and decaf  (NO cream or milk products of any type, may sweeten)                         Carbonated beverages, regular and diet                                    Sports drinks like Gatorade _____________________________________________________________________     TAKE ONLY THESE MEDICATIONS MORNING OF SURGERY: Wellbutrin, Zetia, Synthroid, Junel Fe, Zoloft                                        DO NOT WEAR JEWERLY/  METAL/  PIERCINGS (INCLUDING NO PLASTIC PIERCINGS) DO NOT WEAR LOTIONS, POWDERS, PERFUMES OR NAIL POLISH ON YOUR FINGERNAILS. TOENAIL POLISH IS OK TO WEAR. DO NOT SHAVE FOR 48 HOURS PRIOR TO DAY OF SURGERY.  CONTACTS, GLASSES, OR DENTURES MAY NOT BE WORN TO SURGERY.  REMEMBER: NO SMOKING, VAPING ,  DRUGS OR ALCOHOL FOR 24 HOURS BEFORE YOUR SURGERY.                                    [facility] IS NOT RESPONSIBLE  FOR ANY BELONGINGS.                                                                    Marland Kitchen           [place] -  Preparing for Surgery Before surgery, you can play an important  role.  Because skin is not sterile, your skin needs to be as free of germs as possible.  You can reduce the number of germs on your skin by washing with CHG (chlorahexidine gluconate) soap before surgery.  CHG is an antiseptic cleaner which kills germs and bonds with the skin to continue killing germs even after washing. Please DO NOT use if you have an allergy to CHG or antibacterial soaps.  If your skin becomes reddened/irritated stop using the CHG and inform your nurse when you arrive at Short Stay. Do not shave (including legs and underarms) for at least 48 hours prior to the first CHG shower.  You may shave your face/neck. Please follow these instructions carefully:  1.  Shower with CHG Soap the night before surgery and the  morning of Surgery.  2.  If you choose to wash your hair, wash your hair first as usual with your  normal  shampoo.  3.  After you shampoo, rinse your hair and body thoroughly to remove the  shampoo.                                        4.  Use CHG as you would any other liquid soap.  You can apply chg directly  to the skin and wash , chg soap provided, night before and morning of your surgery.  5.  Apply the CHG Soap to your body ONLY FROM THE NECK DOWN.   Do not use on face/ open                           Wound or open sores. Avoid contact with eyes, ears mouth and genitals (private parts).                       Wash face,  Genitals (private parts) with your normal soap.             6.  Wash thoroughly, paying special attention to the area where your surgery  will be performed.  7.  Thoroughly rinse your body with warm water from the neck down.  8.  DO NOT shower/wash with your normal soap after using and rinsing off  the CHG Soap.             9.  Pat yourself dry with a clean towel.            10.  Wear clean pajamas.            11.  Place clean sheets on your bed the night of your first shower and do not  sleep with pets. Day of Surgery : Do not apply any lotions/  powders the morning of surgery.  Please wear clean clothes to the hospital/surgery center.  IF YOU HAVE ANY SKIN IRRITATION OR PROBLEMS WITH THE SURGICAL SOAP, PLEASE GET A BAR OF GOLD DIAL SOAP AND SHOWER THE NIGHT BEFORE YOUR SURGERY AND THE MORNING OF YOUR SURGERY. PLEASE LET THE NURSE KNOW MORNING OF YOUR SURGERY IF YOU HAD ANY PROBLEMS WITH THE SURGICAL SOAP.   YOUR SURGEON MAY HAVE REQUESTED EXTENDED RECOVERY TIME AFTER YOUR SURGERY. IT COULD BE A  JUST A FEW HOURS  UP TO AN OVERNIGHT STAY.  YOUR SURGEON SHOULD HAVE DISCUSSED THIS WITH  YOU PRIOR TO YOUR SURGERY. IN THE EVENT YOU NEED TO STAY OVERNIGHT PLEASE REFER TO THE FOLLOWING GUIDELINES. YOU MAY HAVE UP TO 4 VISITORS  MAY VISIT IN THE EXTENDED RECOVERY ROOM UNTIL 800 PM ONLY.  ONE  VISITOR AGE 77 AND OVER MAY SPEND THE NIGHT AND MUST BE IN EXTENDED RECOVERY ROOM NO LATER THAN 800 PM . YOUR DISCHARGE TIME AFTER YOU SPEND THE NIGHT IS 900 AM THE MORNING AFTER YOUR SURGERY. YOU MAY PACK A SMALL OVERNIGHT BAG WITH TOILETRIES FOR YOUR OVERNIGHT STAY IF YOU WISH.  REGARDLESS OF IF YOU STAY OVER NIGHT OR ARE DISCHARGED THE SAME DAY YOU WILL BE REQUIRED TO HAVE A RESPONSIBLE ADULT (18 YRS OLD OR OLDER) STAY WITH YOU FOR AT LEAST THE FIRST 24 HOURS  YOUR PRESCRIPTION MEDICATIONS WILL BE PROVIDED DURING YOUR HOSPITAL STAY.  ________________________________________________________________________                                                        QUESTIONS Mechele Claude PRE OP NURSE PHONE [phone].

## 2022-09-23 NOTE — Progress Notes (Signed)
Spoke w/ via phone for pre-op interview---[name] needs dos----UPT               Cordova results------10/06/22 Cordova appt for cbc, type & screen, cmp COVID test -----patient states asymptomatic no test needed Arrive at -------0530 on Monday, 10/10/22 NPO after MN NO Solid Food.  Clear liquids from MN until---0430 Med rec completed Medications to take morning of surgery -----Wellbutrin, Zetia, Synthroid, Junel FE, Zoloft Diabetic medication -----n/a Patient instructed no nail polish to be worn day of surgery Patient instructed to bring photo id and insurance card day of surgery Patient aware to have Driver (ride ) / caregiver    for 24 hours after surgery - husband, [name]  Patient Special Instructions -----Extended / overnight stay instructions given. Pre-Op special Instructions -----none Patient verbalized understanding of instructions that were given at this phone interview. Patient denies shortness of breath, chest pain, fever, cough at this phone interview.

## 2022-10-06 ENCOUNTER — Encounter (HOSPITAL_COMMUNITY)
Admission: RE | Admit: 2022-10-06 | Discharge: 2022-10-06 | Disposition: A | Discharge status: Home / Self Care | Payer: 59 | Source: Ambulatory Visit | Attending: Obstetrics and Gynecology | Admitting: Obstetrics and Gynecology

## 2022-10-06 DIAGNOSIS — Z01818 Encounter for other preprocedural examination: Secondary | ICD-10-CM

## 2022-10-06 DIAGNOSIS — Z01812 Encounter for preprocedural laboratory examination: Secondary | ICD-10-CM | POA: Insufficient documentation

## 2022-10-06 LAB — CBC
HCT: 35.8 % — ABNORMAL LOW (ref 36.0–46.0)
Hemoglobin: 11.2 g/dL — ABNORMAL LOW (ref 12.0–15.0)
MCH: 24.3 pg — ABNORMAL LOW (ref 26.0–34.0)
MCHC: 31.3 g/dL (ref 30.0–36.0)
MCV: 77.8 fL — ABNORMAL LOW (ref 80.0–100.0)
Platelets: 352 10*3/uL (ref 150–400)
RBC: 4.6 MIL/uL (ref 3.87–5.11)
RDW: 14 % (ref 11.5–15.5)
WBC: 5.2 10*3/uL (ref 4.0–10.5)
nRBC: 0 % (ref 0.0–0.2)

## 2022-10-06 LAB — COMPREHENSIVE METABOLIC PANEL
ALT: 53 U/L — ABNORMAL HIGH (ref 0–44)
AST: 28 U/L (ref 15–41)
Albumin: 2.9 g/dL — ABNORMAL LOW (ref 3.5–5.0)
Alkaline Phosphatase: 57 U/L (ref 38–126)
Anion gap: 6 (ref 5–15)
BUN: 8 mg/dL (ref 6–20)
CO2: 26 mmol/L (ref 22–32)
Calcium: 8.6 mg/dL — ABNORMAL LOW (ref 8.9–10.3)
Chloride: 106 mmol/L (ref 98–111)
Creatinine, Ser: 0.79 mg/dL (ref 0.44–1.00)
GFR, Estimated: >60 mL/min (ref >60)
Glucose, Bld: 87 mg/dL (ref 70–99)
Potassium: 3.9 mmol/L (ref 3.5–5.1)
Sodium: 138 mmol/L (ref 135–145)
Total Bilirubin: 0.7 mg/dL (ref 0.3–1.2)
Total Protein: 6.8 g/dL (ref 6.5–8.1)

## 2022-10-09 NOTE — Anesthesia Preprocedure Evaluation (Signed)
Anesthesia Evaluation  Patient identified by MRN, date of birth, ID band Patient awake    Reviewed: Allergy & Precautions, NPO status , Patient's Chart, lab work & pertinent test results  History of Anesthesia Complications Negative for: history of anesthetic complications  Airway Mallampati: II  TM Distance: >3 FB Neck ROM: Full    Dental  (+) Dental Advisory Given, Teeth Intact   Pulmonary neg pulmonary ROS   Pulmonary exam normal        Cardiovascular negative cardio ROS Normal cardiovascular exam     Neuro/Psych negative neurological ROS  negative psych ROS   GI/Hepatic negative GI ROS, Neg liver ROS,,,  Endo/Other  Hypothyroidism    Renal/GU negative Renal ROS     Musculoskeletal negative musculoskeletal ROS (+)    Abdominal   Peds  Hematology  (+) Blood dyscrasia, anemia   Anesthesia Other Findings   Reproductive/Obstetrics                             Anesthesia Physical Anesthesia Plan  ASA: 2  Anesthesia Plan: General   Post-op Pain Management: Celebrex PO (pre-op)* and Tylenol PO (pre-op)*   Induction: Intravenous  PONV Risk Score and Plan: 3 and Treatment may vary due to age or medical condition, Ondansetron, Dexamethasone, Midazolam and Scopolamine patch - Pre-op  Airway Management Planned: Oral ETT  Additional Equipment: None  Intra-op Plan:   Post-operative Plan: Extubation in OR  Informed Consent: I have reviewed the patients History and Physical, chart, labs and discussed the procedure including the risks, benefits and alternatives for the proposed anesthesia with the patient or authorized representative who has indicated his/her understanding and acceptance.     Dental advisory given  Plan Discussed with: CRNA and Anesthesiologist  Anesthesia Plan Comments:        Anesthesia Quick Evaluation

## 2022-10-09 NOTE — H&P (Signed)
[name] is an 42 y.o. female G2P2 with intramural fibroids - abn uterine bleeding - menorrhagia, likely submucosal fibroids x 2.  Patient desired definitive management - d/w pt r/b/a of RA TLH/BS, possible cystoscopy.  D/W pt process and expectations.    Pertinent Gynecological History: No abn pap, last 03/02/22, HR HPV neg No STIs G2P2 SVD x 2 Menstrual History:  Patient's last menstrual period was 08/29/2022 (exact date).    Past Medical History:  Diagnosis Date   Abnormal uterine bleeding (AUB)    Anxiety    Cancer (HCC)    thyroid   History of papillary adenocarcinoma of thyroid last imaging in epic 02-18-2013 no recurrence   01-20-2003  s/p partial thyroidectomy for isthmysectomy for nodule:  03-07-2003  s/p  total thyroidecotmy for cancer   Hyperlipidemia    Hypothyroidism, postsurgical    endocrinoloigst-  dr balan--  hx papillary carcinoma s/p  totoal thyroidectomy 03-07-2003   Submucous myoma of uterus    Tachycardia 2012   during pregnancy   Wears contact lenses    Wears glasses     Past Surgical History:  Procedure Laterality Date   DILATATION & CURETTAGE/HYSTEROSCOPY WITH MYOSURE N/A 06/15/2017   Procedure: DILATATION & CURETTAGE/HYSTEROSCOPY WITH MYOSURE;  Surgeon: Huel Cote, MD;  Location: [facility] ;  Service: Gynecology;  Laterality: N/A;  removal of leiomyomata   HYSTEROSCOPY  06/15/2017   leiomyoma resection  06/15/2017   THYROIDECTOMY, PARTIAL  01-20-2003    dr Daphine Deutscher [facility]   isthmusectomy for thyroid nodule   TOTAL THYROIDECTOMY  03-07-2003   dr Daphine Deutscher  [facility]   papillary carcinoma   TRANSTHORACIC ECHOCARDIOGRAM  12/15/2015   ef  60%    Family History  Problem Relation Age of Onset   Diabetes Father    Hypertension Maternal Grandmother    Asthma Maternal Grandfather    Diabetes Maternal Grandfather    Stroke Maternal Grandfather    Mitral valve prolapse Sister    Diabetes Paternal Grandmother    Stroke Paternal  Grandmother    Cancer Maternal Aunt        leaukemia    Social History:  reports that she has never smoked. She has never used smokeless tobacco. She reports current alcohol use. She reports that she does not use drugs. Married, works as Runner, broadcasting/film/video  Allergies:  Allergies  Allergen Reactions   Azithromycin Other (See Comments)    Stomach cramps  PCN  Meds buproprion, ezetimibe, Hailey Fe, levothyroxine  Review of Systems  Constitutional: Negative.   HENT: Negative.    Respiratory: Negative.    Gastrointestinal: Negative.   Genitourinary:  Positive for menstrual problem and pelvic pain.  Musculoskeletal: Negative.   Skin: Negative.   Neurological: Negative.   Psychiatric/Behavioral: Negative.      Height 5\' 1"  (1.549 m), weight 66.2 kg, last menstrual period 08/29/2022. Physical Exam Constitutional:      Appearance: Normal appearance.  Cardiovascular:     Rate and Rhythm: Normal rate and regular rhythm.  Pulmonary:     Effort: Pulmonary effort is normal.     Breath sounds: Normal breath sounds.  Abdominal:     General: Bowel sounds are normal.     Palpations: Abdomen is soft.  Genitourinary:    General: Normal vulva.     Rectum: Normal.  Musculoskeletal:        General: Normal range of motion.     Cervical back: Normal range of motion and neck supple.  Skin:  General: Skin is warm and dry.  Neurological:     General: No focal deficit present.     Mental Status: She is alert and oriented to person, place, and time.  Psychiatric:        Mood and Affect: Mood normal.        Behavior: Behavior normal.    US uterine fibroids - appear submucosal (has h/o), also R ovarian cyst   Assessment/Plan: 41yo G2P2 with symptomatic uterine fibroids for RA, TLH/BS, possible cystoscopy D/w pt r/b/a Also process and expectations Will proceed  [name] 10/09/2022, 1:40 PM

## 2022-10-10 ENCOUNTER — Encounter (HOSPITAL_BASED_OUTPATIENT_CLINIC_OR_DEPARTMENT_OTHER): Payer: Self-pay | Admitting: Obstetrics and Gynecology

## 2022-10-10 ENCOUNTER — Ambulatory Visit (HOSPITAL_BASED_OUTPATIENT_CLINIC_OR_DEPARTMENT_OTHER): Payer: 59 | Admitting: Anesthesiology

## 2022-10-10 ENCOUNTER — Observation Stay (HOSPITAL_BASED_OUTPATIENT_CLINIC_OR_DEPARTMENT_OTHER)
Admission: RE | Admit: 2022-10-10 | Discharge: 2022-10-10 | Disposition: A | Discharge status: Home / Self Care | Payer: 59 | Attending: Obstetrics and Gynecology | Admitting: Obstetrics and Gynecology

## 2022-10-10 ENCOUNTER — Other Ambulatory Visit: Payer: Self-pay

## 2022-10-10 ENCOUNTER — Encounter (HOSPITAL_BASED_OUTPATIENT_CLINIC_OR_DEPARTMENT_OTHER)
Admission: RE | Disposition: A | Discharge status: Home / Self Care | Payer: Self-pay | Source: Home / Self Care | Attending: Obstetrics and Gynecology

## 2022-10-10 DIAGNOSIS — N920 Excessive and frequent menstruation with regular cycle: Secondary | ICD-10-CM | POA: Diagnosis not present

## 2022-10-10 DIAGNOSIS — D259 Leiomyoma of uterus, unspecified: Principal | ICD-10-CM | POA: Insufficient documentation

## 2022-10-10 DIAGNOSIS — Z9889 Other specified postprocedural states: Secondary | ICD-10-CM

## 2022-10-10 DIAGNOSIS — Z85850 Personal history of malignant neoplasm of thyroid: Secondary | ICD-10-CM | POA: Diagnosis not present

## 2022-10-10 DIAGNOSIS — Z01818 Encounter for other preprocedural examination: Principal | ICD-10-CM

## 2022-10-10 DIAGNOSIS — E039 Hypothyroidism, unspecified: Secondary | ICD-10-CM | POA: Diagnosis not present

## 2022-10-10 HISTORY — DX: Presence of spectacles and contact lenses: Z97.3

## 2022-10-10 HISTORY — PX: CYSTOSCOPY: SHX5120

## 2022-10-10 HISTORY — PX: ROBOTIC ASSISTED LAPAROSCOPIC HYSTERECTOMY AND SALPINGECTOMY: SHX6379

## 2022-10-10 HISTORY — DX: Anxiety disorder, unspecified: F41.9

## 2022-10-10 HISTORY — DX: Hyperlipidemia, unspecified: E78.5

## 2022-10-10 LAB — BASIC METABOLIC PANEL
Anion gap: 8 (ref 5–15)
BUN: 9 mg/dL (ref 6–20)
CO2: 22 mmol/L (ref 22–32)
Calcium: 7.5 mg/dL — ABNORMAL LOW (ref 8.9–10.3)
Chloride: 101 mmol/L (ref 98–111)
Creatinine, Ser: 0.92 mg/dL (ref 0.44–1.00)
GFR, Estimated: >60 mL/min (ref >60)
Glucose, Bld: 182 mg/dL — ABNORMAL HIGH (ref 70–99)
Potassium: 4.1 mmol/L (ref 3.5–5.1)
Sodium: 131 mmol/L — ABNORMAL LOW (ref 135–145)

## 2022-10-10 LAB — TYPE AND SCREEN
ABO/RH(D): O POS
Antibody Screen: NEGATIVE

## 2022-10-10 LAB — CBC
HCT: 30.5 % — ABNORMAL LOW (ref 36.0–46.0)
Hemoglobin: 9.6 g/dL — ABNORMAL LOW (ref 12.0–15.0)
MCH: 24.7 pg — ABNORMAL LOW (ref 26.0–34.0)
MCHC: 31.5 g/dL (ref 30.0–36.0)
MCV: 78.4 fL — ABNORMAL LOW (ref 80.0–100.0)
Platelets: 322 10*3/uL (ref 150–400)
RBC: 3.89 MIL/uL (ref 3.87–5.11)
RDW: 14.1 % (ref 11.5–15.5)
WBC: 11.5 10*3/uL — ABNORMAL HIGH (ref 4.0–10.5)
nRBC: 0 % (ref 0.0–0.2)

## 2022-10-10 LAB — POCT PREGNANCY, URINE: Preg Test, Ur: NEGATIVE

## 2022-10-10 SURGERY — XI ROBOTIC ASSISTED LAPAROSCOPIC HYSTERECTOMY AND SALPINGECTOMY
Anesthesia: General | Site: Pelvis

## 2022-10-10 MED ORDER — FENTANYL CITRATE (PF) 100 MCG/2ML IJ SOLN: 25.0000 ug | INTRAMUSCULAR | Status: DC | PRN

## 2022-10-10 MED ORDER — SCOPOLAMINE 1 MG/3DAYS TD PT72
MEDICATED_PATCH | TRANSDERMAL | Status: AC
  Filled 2022-10-10: qty 1

## 2022-10-10 MED ORDER — CEFAZOLIN SODIUM-DEXTROSE 2-4 GM/100ML-% IV SOLN
2.0000 g | INTRAVENOUS | Status: AC
  Administered 2022-10-10: 2 g via INTRAVENOUS

## 2022-10-10 MED ORDER — PHENYLEPHRINE 80 MCG/ML (10ML) SYRINGE FOR IV PUSH (FOR BLOOD PRESSURE SUPPORT)
PREFILLED_SYRINGE | INTRAVENOUS | Status: AC
  Filled 2022-10-10: qty 10

## 2022-10-10 MED ORDER — ACETAMINOPHEN 500 MG PO TABS
1000.0000 mg | ORAL_TABLET | ORAL | Status: AC
  Administered 2022-10-10: 1000 mg via ORAL

## 2022-10-10 MED ORDER — EZETIMIBE 10 MG PO TABS
10.0000 mg | ORAL_TABLET | Freq: Every day | ORAL | Status: DC
  Filled 2022-10-10: qty 1

## 2022-10-10 MED ORDER — DIPHENHYDRAMINE HCL 50 MG/ML IJ SOLN
INTRAMUSCULAR | Status: AC
  Filled 2022-10-10: qty 1

## 2022-10-10 MED ORDER — PHENYLEPHRINE HCL-NACL 20-0.9 MG/250ML-% IV SOLN
INTRAVENOUS | Status: DC | PRN
  Administered 2022-10-10: 80 ug via INTRAVENOUS
  Administered 2022-10-10: 160 ug via INTRAVENOUS
  Administered 2022-10-10 (×2): 80 ug via INTRAVENOUS

## 2022-10-10 MED ORDER — ACETAMINOPHEN 500 MG PO TABS
ORAL_TABLET | ORAL | Status: AC
  Filled 2022-10-10: qty 2

## 2022-10-10 MED ORDER — ROCURONIUM BROMIDE 10 MG/ML (PF) SYRINGE
PREFILLED_SYRINGE | INTRAVENOUS | Status: DC | PRN
  Administered 2022-10-10: 50 mg via INTRAVENOUS

## 2022-10-10 MED ORDER — PROPOFOL 10 MG/ML IV BOLUS
INTRAVENOUS | Status: AC
  Filled 2022-10-10: qty 20

## 2022-10-10 MED ORDER — DEXAMETHASONE SODIUM PHOSPHATE 10 MG/ML IJ SOLN
INTRAMUSCULAR | Status: DC | PRN
  Administered 2022-10-10: 10 mg via INTRAVENOUS

## 2022-10-10 MED ORDER — FENTANYL CITRATE (PF) 250 MCG/5ML IJ SOLN
INTRAMUSCULAR | Status: DC | PRN
  Administered 2022-10-10: 50 ug via INTRAVENOUS
  Administered 2022-10-10: 25 ug via INTRAVENOUS
  Administered 2022-10-10 (×2): 50 ug via INTRAVENOUS
  Administered 2022-10-10: 25 ug via INTRAVENOUS

## 2022-10-10 MED ORDER — BUPROPION HCL ER (XL) 150 MG PO TB24
150.0000 mg | ORAL_TABLET | Freq: Every day | ORAL | Status: DC
  Filled 2022-10-10: qty 1

## 2022-10-10 MED ORDER — GABAPENTIN 300 MG PO CAPS
300.0000 mg | ORAL_CAPSULE | ORAL | Status: AC
  Administered 2022-10-10: 300 mg via ORAL

## 2022-10-10 MED ORDER — SODIUM CHLORIDE 0.9 % IR SOLN
Status: DC | PRN
  Administered 2022-10-10: 200 mL
  Administered 2022-10-10: 1000 mL

## 2022-10-10 MED ORDER — OXYCODONE-ACETAMINOPHEN 5-325 MG PO TABS
ORAL_TABLET | ORAL | Status: AC
  Filled 2022-10-10: qty 1

## 2022-10-10 MED ORDER — GUAIFENESIN 100 MG/5ML PO LIQD: 15.0000 mL | ORAL | Status: DC | PRN

## 2022-10-10 MED ORDER — OXYCODONE HCL 5 MG/5ML PO SOLN: 5.0000 mg | Freq: Once | ORAL | Status: DC | PRN

## 2022-10-10 MED ORDER — ONDANSETRON HCL 4 MG/2ML IJ SOLN: 4.0000 mg | Freq: Four times a day (QID) | INTRAMUSCULAR | Status: DC | PRN

## 2022-10-10 MED ORDER — SIMETHICONE 80 MG PO CHEW: 80.0000 mg | CHEWABLE_TABLET | Freq: Four times a day (QID) | ORAL | Status: DC | PRN

## 2022-10-10 MED ORDER — OXYCODONE-ACETAMINOPHEN 5-325 MG PO TABS
1.0000 | ORAL_TABLET | ORAL | Status: DC | PRN
  Administered 2022-10-10 (×2): 1 via ORAL

## 2022-10-10 MED ORDER — MENTHOL 3 MG MT LOZG: 1.0000 | LOZENGE | OROMUCOSAL | Status: DC | PRN

## 2022-10-10 MED ORDER — STERILE WATER FOR IRRIGATION IR SOLN
Status: DC | PRN
  Administered 2022-10-10: 1000 mL

## 2022-10-10 MED ORDER — ALUM & MAG HYDROXIDE-SIMETH 200-200-20 MG/5ML PO SUSP: 30.0000 mL | ORAL | Status: DC | PRN

## 2022-10-10 MED ORDER — ALBUMIN HUMAN 5 % IV SOLN
INTRAVENOUS | Status: AC
  Filled 2022-10-10: qty 250

## 2022-10-10 MED ORDER — GABAPENTIN 300 MG PO CAPS
ORAL_CAPSULE | ORAL | Status: AC
  Filled 2022-10-10: qty 1

## 2022-10-10 MED ORDER — HYDROMORPHONE HCL 1 MG/ML IJ SOLN: 0.2000 mg | INTRAMUSCULAR | Status: DC | PRN

## 2022-10-10 MED ORDER — CEFAZOLIN SODIUM-DEXTROSE 2-4 GM/100ML-% IV SOLN
INTRAVENOUS | Status: AC
  Filled 2022-10-10: qty 100

## 2022-10-10 MED ORDER — IBUPROFEN 800 MG PO TABS: 800.0000 mg | ORAL_TABLET | Freq: Three times a day (TID) | ORAL | Status: DC | PRN

## 2022-10-10 MED ORDER — MIDAZOLAM HCL 2 MG/2ML IJ SOLN
INTRAMUSCULAR | Status: AC
  Filled 2022-10-10: qty 2

## 2022-10-10 MED ORDER — CELECOXIB 200 MG PO CAPS
200.0000 mg | ORAL_CAPSULE | Freq: Once | ORAL | Status: AC
  Administered 2022-10-10: 200 mg via ORAL

## 2022-10-10 MED ORDER — LIDOCAINE HCL (PF) 2 % IJ SOLN
INTRAMUSCULAR | Status: AC
  Filled 2022-10-10: qty 5

## 2022-10-10 MED ORDER — POVIDONE-IODINE 10 % EX SWAB: 2.0000 | Freq: Once | CUTANEOUS | Status: DC

## 2022-10-10 MED ORDER — BUPIVACAINE HCL (PF) 0.25 % IJ SOLN
INTRAMUSCULAR | Status: DC | PRN
  Administered 2022-10-10: 14 mL

## 2022-10-10 MED ORDER — SUGAMMADEX SODIUM 200 MG/2ML IV SOLN
INTRAVENOUS | Status: DC | PRN
  Administered 2022-10-10: 150 mg via INTRAVENOUS

## 2022-10-10 MED ORDER — ROCURONIUM BROMIDE 10 MG/ML (PF) SYRINGE
PREFILLED_SYRINGE | INTRAVENOUS | Status: AC
  Filled 2022-10-10: qty 10

## 2022-10-10 MED ORDER — DEXAMETHASONE SODIUM PHOSPHATE 10 MG/ML IJ SOLN
INTRAMUSCULAR | Status: AC
  Filled 2022-10-10: qty 1

## 2022-10-10 MED ORDER — FENTANYL CITRATE (PF) 250 MCG/5ML IJ SOLN
INTRAMUSCULAR | Status: AC
  Filled 2022-10-10: qty 5

## 2022-10-10 MED ORDER — PROPOFOL 10 MG/ML IV BOLUS
INTRAVENOUS | Status: DC | PRN
  Administered 2022-10-10: 200 mg via INTRAVENOUS

## 2022-10-10 MED ORDER — LIDOCAINE 2% (20 MG/ML) 5 ML SYRINGE
INTRAMUSCULAR | Status: DC | PRN
  Administered 2022-10-10: 60 mg via INTRAVENOUS

## 2022-10-10 MED ORDER — MIDAZOLAM HCL 2 MG/2ML IJ SOLN
INTRAMUSCULAR | Status: DC | PRN
  Administered 2022-10-10: 2 mg via INTRAVENOUS

## 2022-10-10 MED ORDER — LACTATED RINGERS IV SOLN: INTRAVENOUS | Status: DC

## 2022-10-10 MED ORDER — PROMETHAZINE HCL 25 MG/ML IJ SOLN: 6.2500 mg | INTRAMUSCULAR | Status: DC | PRN

## 2022-10-10 MED ORDER — OXYCODONE HCL 5 MG PO TABS: 5.0000 mg | ORAL_TABLET | Freq: Once | ORAL | Status: DC | PRN

## 2022-10-10 MED ORDER — DIPHENHYDRAMINE HCL 50 MG/ML IJ SOLN
INTRAMUSCULAR | Status: DC | PRN
Start: 2022-10-10 — End: 2022-10-10
  Administered 2022-10-10: 12.5 mg via INTRAVENOUS

## 2022-10-10 MED ORDER — ONDANSETRON HCL 4 MG/2ML IJ SOLN
INTRAMUSCULAR | Status: AC
  Filled 2022-10-10: qty 2

## 2022-10-10 MED ORDER — LEVOTHYROXINE SODIUM 125 MCG PO TABS
125.0000 ug | ORAL_TABLET | Freq: Every day | ORAL | Status: DC
  Filled 2022-10-10: qty 1

## 2022-10-10 MED ORDER — SCOPOLAMINE 1 MG/3DAYS TD PT72
1.0000 | MEDICATED_PATCH | TRANSDERMAL | Status: DC
  Administered 2022-10-10: 1.5 mg via TRANSDERMAL

## 2022-10-10 MED ORDER — SERTRALINE HCL 50 MG PO TABS
75.0000 mg | ORAL_TABLET | Freq: Every day | ORAL | Status: DC
  Filled 2022-10-10: qty 1.5

## 2022-10-10 MED ORDER — ONDANSETRON HCL 4 MG/2ML IJ SOLN
INTRAMUSCULAR | Status: DC | PRN
  Administered 2022-10-10: 4 mg via INTRAVENOUS

## 2022-10-10 MED ORDER — CELECOXIB 200 MG PO CAPS
ORAL_CAPSULE | ORAL | Status: AC
  Filled 2022-10-10: qty 1

## 2022-10-10 MED ORDER — ONDANSETRON HCL 4 MG PO TABS: 4.0000 mg | ORAL_TABLET | Freq: Four times a day (QID) | ORAL | Status: DC | PRN

## 2022-10-10 SURGICAL SUPPLY — 77 items
ADH SKN CLS APL DERMABOND .7 (GAUZE/BANDAGES/DRESSINGS) ×2
APL SRG 38 LTWT LNG FL B (MISCELLANEOUS)
APPLICATOR ARISTA FLEXITIP XL (MISCELLANEOUS) IMPLANT
CANNULA CAP OBTURATR AIRSEAL 8 (CAP) ×2 IMPLANT
CANNULA REDUCER 12-8 DVNC XI (CANNULA) IMPLANT
CATH FOLEY 3WAY 5CC 16FR (CATHETERS) ×2 IMPLANT
COVER BACK TABLE 60X90IN (DRAPES) ×2 IMPLANT
COVER TIP SHEARS 8 DVNC (MISCELLANEOUS) ×2 IMPLANT
DEFOGGER SCOPE WARMER CLEARIFY (MISCELLANEOUS) ×2 IMPLANT
DERMABOND ADVANCED .7 DNX12 (GAUZE/BANDAGES/DRESSINGS) ×2 IMPLANT
DRAPE ARM DVNC X/XI (DISPOSABLE) ×8 IMPLANT
DRAPE COLUMN DVNC XI (DISPOSABLE) ×2 IMPLANT
DRAPE SURG IRRIG POUCH 19X23 (DRAPES) ×2 IMPLANT
DRAPE UTILITY XL STRL (DRAPES) ×2 IMPLANT
DRIVER NDL MEGA SUTCUT DVNCXI (INSTRUMENTS) ×2 IMPLANT
DRIVER NDLE MEGA SUTCUT DVNCXI (INSTRUMENTS) ×2
DURAPREP 26ML APPLICATOR (WOUND CARE) ×2 IMPLANT
ELECT REM PT RETURN 9FT ADLT (ELECTROSURGICAL) ×2
ELECTRODE REM PT RTRN 9FT ADLT (ELECTROSURGICAL) ×2 IMPLANT
FORCEPS BPLR FENES DVNC XI (FORCEP) ×2 IMPLANT
FORCEPS PROGRASP DVNC XI (FORCEP) IMPLANT
GAUZE 4X4 16PLY ~~LOC~~+RFID DBL (SPONGE) ×2 IMPLANT
GLOVE BIO SURGEON STRL SZ 6 (GLOVE) IMPLANT
GLOVE BIO SURGEON STRL SZ 6.5 (GLOVE) ×6 IMPLANT
GLOVE BIO SURGEON STRL SZ7.5 (GLOVE) IMPLANT
GLOVE BIOGEL PI IND STRL 6.5 (GLOVE) IMPLANT
GLOVE BIOGEL PI IND STRL 7.0 (GLOVE) IMPLANT
HEMOSTAT ARISTA ABSORB 3G PWDR (HEMOSTASIS) IMPLANT
HOLDER FOLEY CATH W/STRAP (MISCELLANEOUS) IMPLANT
IRRIG SUCT STRYKERFLOW 2 WTIP (MISCELLANEOUS) ×2
IRRIGATION SUCT STRKRFLW 2 WTP (MISCELLANEOUS) ×2 IMPLANT
IV NS 1000ML (IV SOLUTION) ×4
IV NS 1000ML BAXH (IV SOLUTION) IMPLANT
KIT PINK PAD W/HEAD ARE REST (MISCELLANEOUS) ×2
KIT PINK PAD W/HEAD ARM REST (MISCELLANEOUS) ×2 IMPLANT
KIT TURNOVER CYSTO (KITS) ×2 IMPLANT
LEGGING LITHOTOMY PAIR STRL (DRAPES) ×2 IMPLANT
MANIFOLD NEPTUNE II (INSTRUMENTS) ×2 IMPLANT
MANIPULATOR ADVINCU DEL 2.5 PL (MISCELLANEOUS) IMPLANT
MANIPULATOR ADVINCU DEL 3.0 PL (MISCELLANEOUS) IMPLANT
MANIPULATOR ADVINCU DEL 3.5 PL (MISCELLANEOUS) IMPLANT
MANIPULATOR ADVINCU DEL 4.0 PL (MISCELLANEOUS) IMPLANT
NDL INSUFFLATION 14GA 120MM (NEEDLE) ×2 IMPLANT
NEEDLE INSUFFLATION 14GA 120MM (NEEDLE) ×2
OBTURATOR OPTICAL STND 8 DVNC (TROCAR) ×2
OBTURATOR OPTICALSTD 8 DVNC (TROCAR) ×2 IMPLANT
OCCLUDER COLPOPNEUMO (BALLOONS) ×2 IMPLANT
PACK ROBOT WH (CUSTOM PROCEDURE TRAY) ×2 IMPLANT
PACK ROBOTIC GOWN (GOWN DISPOSABLE) ×2 IMPLANT
PAD OB MATERNITY 4.3X12.25 (PERSONAL CARE ITEMS) ×2 IMPLANT
PAD PREP 24X48 CUFFED NSTRL (MISCELLANEOUS) ×2 IMPLANT
PROTECTOR NERVE ULNAR (MISCELLANEOUS) ×2 IMPLANT
RETRACTOR WND ALEXIS 18 MED (MISCELLANEOUS) IMPLANT
RTRCTR WOUND ALEXIS 18CM MED (MISCELLANEOUS)
RTRCTR WOUND ALEXIS 18CM SML (INSTRUMENTS)
SAVER CELL AAL HAEMONETICS (INSTRUMENTS) IMPLANT
SCISSORS MNPLR CVD DVNC XI (INSTRUMENTS) ×2 IMPLANT
SEAL UNIV 5-12 XI (MISCELLANEOUS) ×4 IMPLANT
SEALER VESSEL EXT DVNC XI (MISCELLANEOUS) IMPLANT
SET IRRIG Y TYPE TUR BLADDER L (SET/KITS/TRAYS/PACK) IMPLANT
SET TUBE FILTERED XL AIRSEAL (SET/KITS/TRAYS/PACK) ×2 IMPLANT
SLEEVE SCD COMPRESS KNEE MED (STOCKING) ×2 IMPLANT
SPIKE FLUID TRANSFER (MISCELLANEOUS) ×4 IMPLANT
SUT VIC AB 0 CT1 27 (SUTURE)
SUT VIC AB 0 CT1 27XBRD ANBCTR (SUTURE) IMPLANT
SUT VIC AB 4-0 PS2 18 (SUTURE) ×4 IMPLANT
SUT VICRYL 0 UR6 27IN ABS (SUTURE) IMPLANT
SUT VLOC 180 0 9IN GS21 (SUTURE) ×2 IMPLANT
TIP RUMI ORANGE 6.7MMX12CM (TIP) IMPLANT
TIP UTERINE 5.1X6CM LAV DISP (MISCELLANEOUS) IMPLANT
TIP UTERINE 6.7X10CM GRN DISP (MISCELLANEOUS) IMPLANT
TIP UTERINE 6.7X6CM WHT DISP (MISCELLANEOUS) IMPLANT
TIP UTERINE 6.7X8CM BLUE DISP (MISCELLANEOUS) IMPLANT
TOWEL OR 17X24 6PK STRL BLUE (TOWEL DISPOSABLE) ×2 IMPLANT
TROCAR PORT AIRSEAL 8X100 (TROCAR) IMPLANT
WATER STERILE IRR 1000ML POUR (IV SOLUTION) ×2 IMPLANT
WATER STERILE IRR 500ML POUR (IV SOLUTION) IMPLANT

## 2022-10-10 NOTE — Transfer of Care (Signed)
Immediate Anesthesia Transfer of Care Note  Patient: [name]  Procedure(s) Performed: XI ROBOTIC ASSISTED LAPAROSCOPIC HYSTERECTOMY AND SALPINGECTOMY (Bilateral: Pelvis) CYSTOSCOPY (Bladder)  Patient Location: PACU  Anesthesia Type:General  Level of Consciousness: drowsy and patient cooperative  Airway & Oxygen Therapy: Patient Spontanous Breathing and Patient connected to nasal cannula oxygen  Post-op Assessment: Report given to RN and Post -op Vital signs reviewed and stable  Post vital signs: Reviewed and stable  Last Vitals:  Vitals Value Taken Time  BP 124/76 10/10/22 0925  Temp    Pulse 106 10/10/22 0927  Resp 11 10/10/22 0927  SpO2 96 % 10/10/22 0927  Vitals shown include unfiled device data.  Last Pain:  Vitals:   10/10/22 0641  TempSrc: Oral  PainSc:       Patients Stated Pain Goal: 3 (10/10/22 0602)  Complications: No notable events documented.

## 2022-10-10 NOTE — Progress Notes (Signed)
Day of Surgery Procedure(s) (LRB): XI ROBOTIC ASSISTED LAPAROSCOPIC HYSTERECTOMY AND SALPINGECTOMY (Bilateral) CYSTOSCOPY (N/A)  Subjective: Patient reports tolerating PO and no problems voiding.  Ambulating.  Pain controlled  Objective: I have reviewed patient's vital signs, intake and output, medications, and labs.  General: alert and no distress Resp: clear to auscultation bilaterally Cardio: regular rate and rhythm GI: soft, non-tender; bowel sounds normal; no masses,  no organomegaly Extremities: extremities normal, atraumatic, no cyanosis or edema Inc C/D/I  Assessment: s/p Procedure(s): XI ROBOTIC ASSISTED LAPAROSCOPIC HYSTERECTOMY AND SALPINGECTOMY (Bilateral) CYSTOSCOPY (N/A): stable and progressing well  Plan: Encourage ambulation Discharge home with f/u in 2 and 6 weeks - given meds preop  LOS: 0 days    Sherian Rein, MD 10/10/2022, 4:10 PM

## 2022-10-10 NOTE — Discharge Summary (Signed)
Physician Discharge Summary  Patient ID: [name] MRN: [id number] DOB/AGE: Nov 19, 1980 42 y.o.  Admit date: 10/10/2022 Discharge date: 10/10/2022  Admission Diagnoses:  Discharge Diagnoses:  Principal Problem:   S/P robot-assisted surgical procedure   Discharged Condition: good  Hospital Course: admitted for hysterctomy.  Underwent DaVinci Robot assisted Total laparoscopic hysterectomy/Bilateral Salpingectomy/cystoscopy without complication. Uncomplicated postop course - ambulating, voiding, tolerating po, pain controlled, will d/c home  Consults: None  Significant Diagnostic Studies: labs: CBC, BMP  Treatments: surgery: RA TLH/BS/cystoscopy  Discharge Exam: Blood pressure 115/70, pulse 99, temperature 98.2 F (36.8 C), temperature source Oral, resp. rate 16, height 5\' 1"  (1.549 m), weight 66.3 kg, last menstrual period 09/30/2022, SpO2 99%. General appearance: alert and no distress Resp: clear to auscultation bilaterally Cardio: regular rate and rhythm GI: soft, non-tender; bowel sounds normal; no masses,  no organomegaly Extremities: extremities normal, atraumatic, no cyanosis or edema Incision/Wound:C/D/I  Disposition: Discharge disposition: 01-Home or Self Care       Discharge Instructions     Call MD for:  persistant nausea and vomiting   Complete by: As directed    Call MD for:  redness, tenderness, or signs of infection (pain, swelling, redness, odor or green/yellow discharge around incision site)   Complete by: As directed    Call MD for:  severe uncontrolled pain   Complete by: As directed    Diet - low sodium heart healthy   Complete by: As directed    Discharge instructions   Complete by: As directed    Call [phone] with questions or problems (or Dr Ellyn Hack [phone])   Driving Restrictions   Complete by: As directed    While taking strong pain medicine   Increase activity slowly   Complete by: As directed    Lifting restrictions    Complete by: As directed    No greater than 10-15lbs for 6 weeks   May shower / Bathe   Complete by: As directed    May walk up steps   Complete by: As directed    Sexual Activity Restrictions   Complete by: As directed    Pelvic rest - no douching, tampons or sex for 6 weeks      Allergies as of 10/10/2022       Reactions   Azithromycin Other (See Comments)   Stomach cramps        Medication List     STOP taking these medications    Junel FE 1/20 1-20 MG-MCG tablet Generic drug: norethindrone-ethinyl estradiol-FE       TAKE these medications    buPROPion 150 MG 24 hr tablet Commonly known as: WELLBUTRIN XL Take 150 mg by mouth daily. Patient unsure of dosage.   ezetimibe 10 MG tablet Commonly known as: ZETIA Take 10 mg by mouth daily.   ibuprofen 200 MG tablet Commonly known as: Motrin IB Take 3 tablets (600 mg total) by mouth every 6 (six) hours as needed.   levothyroxine 125 MCG tablet Commonly known as: SYNTHROID Take 125 mcg by mouth daily before breakfast.   multivitamin with minerals Tabs tablet Take 1 tablet by mouth daily.   sertraline 50 MG tablet Commonly known as: ZOLOFT Take 50 mg by mouth daily. PT TAKES 1 1/2 TABLET        Follow-up Information     Bovard-Stuckert, Tanis Burnley, MD. Schedule an appointment as soon as possible for a visit in 2 week(s).   Specialty: Obstetrics and Gynecology Why: and 6 weeks for postop check  Contact information: [address] [place] [address] [phone]                 Signed: Sherian Rein 10/10/2022, 4:16 PM

## 2022-10-10 NOTE — Interval H&P Note (Signed)
History and Physical Interval Note:  10/10/2022 7:21 AM  [name]  has presented today for surgery, with the diagnosis of menorrhagia.  The various methods of treatment have been discussed with the patient and family. After consideration of risks, benefits and other options for treatment, the patient has consented to  Procedure(s): XI ROBOTIC ASSISTED LAPAROSCOPIC HYSTERECTOMY AND SALPINGECTOMY (Bilateral) CYSTOSCOPY (N/A) as a surgical intervention.  The patient's history has been reviewed, patient examined, no change in status, stable for surgery.  I have reviewed the patient's chart and labs.  Questions were answered to the patient's satisfaction.     Ragnar Waas Bovard-Stuckert

## 2022-10-10 NOTE — Brief Op Note (Signed)
10/10/2022  9:24 AM  PATIENT:  [name]  42 y.o. female  PRE-OPERATIVE DIAGNOSIS:  menorrhagia, submucosal fibroids  POST-OPERATIVE DIAGNOSIS:  menorrhagia, isubmucosal fibroids  PROCEDURE:  Procedure(s): XI ROBOTIC ASSISTED LAPAROSCOPIC HYSTERECTOMY AND SALPINGECTOMY (Bilateral) CYSTOSCOPY (N/A)  SURGEON:  Surgeons and Role:    * Bovard-Stuckert, Augusto Gamble, MD - Primary  ASSISTANTS: Karmen Stabs, RNFA   ANESTHESIA:   local and general  EBL:  15 mL uop and IVF per anesthesia  BLOOD ADMINISTERED:none  DRAINS: Urinary Catheter (Foley)   LOCAL MEDICATIONS USED:  MARCAINE     SPECIMEN:  Source of Specimen:  uterus, cervix, B fallopian tubes  DISPOSITION OF SPECIMEN:  PATHOLOGY  COUNTS:  YES  TOURNIQUET:  * No tourniquets in log *  DICTATION: .Other Dictation: Dictation Number [id number]  PLAN OF CARE:  extended recovery  PATIENT DISPOSITION:  PACU - hemodynamically stable.   Delay start of Pharmacological VTE agent (>24hrs) due to surgical blood loss or risk of bleeding: not applicable

## 2022-10-10 NOTE — Anesthesia Postprocedure Evaluation (Signed)
Anesthesia Post Note  Patient: [name]  Procedure(s) Performed: XI ROBOTIC ASSISTED LAPAROSCOPIC HYSTERECTOMY AND SALPINGECTOMY (Bilateral: Pelvis) CYSTOSCOPY (Bladder)     Patient location during evaluation: PACU Anesthesia Type: General Level of consciousness: awake and alert Pain management: pain level controlled Vital Signs Assessment: post-procedure vital signs reviewed and stable Respiratory status: spontaneous breathing, nonlabored ventilation and respiratory function stable Cardiovascular status: stable and blood pressure returned to baseline Anesthetic complications: no   No notable events documented.  Last Vitals:  Vitals:   10/10/22 1045 10/10/22 1114  BP: 123/76 116/87  Pulse: (!) 101 (!) 106  Resp: 16 16  Temp: 37.1 C   SpO2: 98% 95%    Last Pain:  Vitals:   10/10/22 1045  TempSrc:   PainSc: 2                  Beryle Lathe

## 2022-10-10 NOTE — Anesthesia Procedure Notes (Signed)
Procedure Name: Intubation Date/Time: 10/10/2022 7:39 AM  Performed by: Dairl Ponder, CRNAPre-anesthesia Checklist: Patient identified, Emergency Drugs available, Suction available and Patient being monitored Patient Re-evaluated:Patient Re-evaluated prior to induction Oxygen Delivery Method: Circle System Utilized Preoxygenation: Pre-oxygenation with 100% oxygen Induction Type: IV induction Ventilation: Mask ventilation without difficulty Laryngoscope Size: Mac and 3 Grade View: Grade II Tube type: Oral Tube size: 7.0 mm Number of attempts: 1 Airway Equipment and Method: Stylet and Oral airway Placement Confirmation: ETT inserted through vocal cords under direct vision, positive ETCO2 and breath sounds checked- equal and bilateral Secured at: 20 cm Tube secured with: Tape Dental Injury: Teeth and Oropharynx as per pre-operative assessment

## 2022-10-10 NOTE — Op Note (Signed)
NAME: [name], RORER MEDICAL RECORD NO: [id number] ACCOUNT NO: [id number] DATE OF BIRTH: Sep 22, 1980 FACILITY: [facility] LOCATION: [facility] PHYSICIAN: Sherian Rein, MD  Operative Report   DATE OF PROCEDURE: 10/10/2022  PREOPERATIVE DIAGNOSIS:  Menorrhagia, uterine fibroids.  PREOPERATIVE DIAGNOSIS:  Menorrhagia, uterine fibroids.  PROCEDURE:  da Vinci Robot-assisted laparoscopic hysterectomy with bilateral salpingectomy and cystoscopy.  SURGEON:  Sherian Rein, MD  ASSISTANT:  Herma Mering, RNFA  ANESTHESIA:  Local and general.  ESTIMATED BLOOD LOSS:  15 mL.  URINE OUTPUT AND INTRAVENOUS FLUIDS:  Per anesthesia records.  COMPLICATIONS:  None.  PATHOLOGY:  Uterus, cervix, bilateral fallopian.  DESCRIPTION OF PROCEDURE:  After informed consent was reviewed with the patient and her husband, she was transported to the operating room, placed on table in supine position.  General anesthesia was induced and found to be adequate.  She was then  prepped and draped in the normal sterile fashion and a Foley catheter was sterilely placed.  After an appropriate timeout was performed and a Foley catheter was sterilely placed and an Advincula was used for uterine manipulation accessory was placed.   Her uterus was sounded to 9 cm.  Gown and gloves were changed.  Attention was turned to abdominal portion of the case.  Approximately 8 mm supraumbilical incision was made.  The fascia was cleared off and using Veress needle, pneumoperitoneum was  obtained after passing the hanging drop test with an opening pressure of 1 mmHg.  The trocar was placed under direct visualization.  Accessory ports were placed on both the right and left.  The robot was docked.  Surgeon broke scrub and went to the  console.  At the console the left fallopian tube was excised using the vessel sealer to the level of the cornu.  The round ligament was ligated as well as the cardinal ligaments to the level of  the cervix. The endocervix pedicles were made to the level  of the cup was seen.  The uterine arteries were doubly ligated.  Attention was turned to the right, which, in a similar fashion, the fimbriated end of the right tube was identified at this time a paratubal cyst was seen and was removed.  The tube was  excised to the level of the cornu.  The pedicles were made through the uteroovarian and round ligament through the cardinal ligaments to the level of the cervix.  Uterine artery was doubly ligated.  The bladder flap was created.  The cuff was isolated  entirely.  The uterus was then delivered through the vagina.  The cuff was irrigated and closed with the barbed suture.  Ureters were identified.  A cystoscopy was also performed revealing bilateral ureteral jets.  The ports were closed with  4-0 Vicryl and Dermabond was applied.  The patient tolerated the procedure well.  Sponge, lap, and needle count was correct x2 per the operating room staff.   PUS D: 10/10/2022 9:38:46 am T: 10/10/2022 9:58:00 am  JOB: [id number]/ [id number]

## 2022-10-10 NOTE — Interval H&P Note (Signed)
History and Physical Interval Note:  10/10/2022 7:21 AM  [name]  has presented today for surgery, with the diagnosis of menorrhagia.  The various methods of treatment have been discussed with the patient and family. After consideration of risks, benefits and other options for treatment, the patient has consented to  Procedure(s): XI ROBOTIC ASSISTED LAPAROSCOPIC HYSTERECTOMY AND SALPINGECTOMY (Bilateral) CYSTOSCOPY (N/A) as a surgical intervention.  The patient's history has been reviewed, patient examined, no change in status, stable for surgery.  I have reviewed the patient's chart and labs.  Questions were answered to the patient's satisfaction.     [name]

## 2022-10-11 ENCOUNTER — Encounter (HOSPITAL_BASED_OUTPATIENT_CLINIC_OR_DEPARTMENT_OTHER): Payer: Self-pay | Admitting: Obstetrics and Gynecology

## 2022-10-11 LAB — SURGICAL PATHOLOGY

## 2022-10-13 ENCOUNTER — Other Ambulatory Visit: Payer: Self-pay

## 2022-10-13 ENCOUNTER — Emergency Department (HOSPITAL_BASED_OUTPATIENT_CLINIC_OR_DEPARTMENT_OTHER): Payer: 59

## 2022-10-13 ENCOUNTER — Encounter (HOSPITAL_BASED_OUTPATIENT_CLINIC_OR_DEPARTMENT_OTHER): Payer: Self-pay | Admitting: Emergency Medicine

## 2022-10-13 ENCOUNTER — Emergency Department (HOSPITAL_BASED_OUTPATIENT_CLINIC_OR_DEPARTMENT_OTHER)
Admission: EM | Admit: 2022-10-13 | Discharge: 2022-10-13 | Disposition: A | Discharge status: Home / Self Care | Payer: 59 | Attending: Emergency Medicine | Admitting: Emergency Medicine

## 2022-10-13 DIAGNOSIS — J189 Pneumonia, unspecified organism: Secondary | ICD-10-CM | POA: Insufficient documentation

## 2022-10-13 DIAGNOSIS — R1030 Lower abdominal pain, unspecified: Secondary | ICD-10-CM | POA: Diagnosis not present

## 2022-10-13 DIAGNOSIS — R509 Fever, unspecified: Secondary | ICD-10-CM | POA: Diagnosis present

## 2022-10-13 DIAGNOSIS — Z20822 Contact with and (suspected) exposure to covid-19: Secondary | ICD-10-CM | POA: Insufficient documentation

## 2022-10-13 LAB — URINALYSIS, W/ REFLEX TO CULTURE (INFECTION SUSPECTED)
Bacteria, UA: NONE SEEN
Bilirubin Urine: NEGATIVE
Glucose, UA: NEGATIVE mg/dL
Ketones, ur: NEGATIVE mg/dL
Leukocytes,Ua: NEGATIVE
Nitrite: NEGATIVE
Specific Gravity, Urine: >1.046 — ABNORMAL HIGH (ref 1.005–1.030)
pH: 6.5 (ref 5.0–8.0)

## 2022-10-13 LAB — COMPREHENSIVE METABOLIC PANEL
ALT: 34 U/L (ref 0–44)
AST: 28 U/L (ref 15–41)
Albumin: 2.8 g/dL — ABNORMAL LOW (ref 3.5–5.0)
Alkaline Phosphatase: 77 U/L (ref 38–126)
Anion gap: 8 (ref 5–15)
BUN: 11 mg/dL (ref 6–20)
CO2: 24 mmol/L (ref 22–32)
Calcium: 7.2 mg/dL — ABNORMAL LOW (ref 8.9–10.3)
Chloride: 103 mmol/L (ref 98–111)
Creatinine, Ser: 0.93 mg/dL (ref 0.44–1.00)
GFR, Estimated: >60 mL/min (ref >60)
Glucose, Bld: 116 mg/dL — ABNORMAL HIGH (ref 70–99)
Potassium: 3.1 mmol/L — ABNORMAL LOW (ref 3.5–5.1)
Sodium: 135 mmol/L (ref 135–145)
Total Bilirubin: 0.5 mg/dL (ref 0.3–1.2)
Total Protein: 6 g/dL — ABNORMAL LOW (ref 6.5–8.1)

## 2022-10-13 LAB — CBC WITH DIFFERENTIAL/PLATELET
Abs Immature Granulocytes: 0.06 10*3/uL (ref 0.00–0.07)
Basophils Absolute: 0 10*3/uL (ref 0.0–0.1)
Basophils Relative: 0 %
Eosinophils Absolute: 0.1 10*3/uL (ref 0.0–0.5)
Eosinophils Relative: 1 %
HCT: 29.3 % — ABNORMAL LOW (ref 36.0–46.0)
Hemoglobin: 9.4 g/dL — ABNORMAL LOW (ref 12.0–15.0)
Immature Granulocytes: 1 %
Lymphocytes Relative: 8 %
Lymphs Abs: 0.7 10*3/uL (ref 0.7–4.0)
MCH: 24.3 pg — ABNORMAL LOW (ref 26.0–34.0)
MCHC: 32.1 g/dL (ref 30.0–36.0)
MCV: 75.7 fL — ABNORMAL LOW (ref 80.0–100.0)
Monocytes Absolute: 0.4 10*3/uL (ref 0.1–1.0)
Monocytes Relative: 4 %
Neutro Abs: 7.9 10*3/uL — ABNORMAL HIGH (ref 1.7–7.7)
Neutrophils Relative %: 86 %
Platelets: 330 10*3/uL (ref 150–400)
RBC: 3.87 MIL/uL (ref 3.87–5.11)
RDW: 14.3 % (ref 11.5–15.5)
WBC: 9.2 10*3/uL (ref 4.0–10.5)
nRBC: 0 % (ref 0.0–0.2)

## 2022-10-13 LAB — LACTIC ACID, PLASMA: Lactic Acid, Venous: 0.8 mmol/L (ref 0.5–1.9)

## 2022-10-13 LAB — SARS CORONAVIRUS 2 BY RT PCR: SARS Coronavirus 2 by RT PCR: NEGATIVE

## 2022-10-13 MED ORDER — ACETAMINOPHEN 500 MG PO TABS
1000.0000 mg | ORAL_TABLET | Freq: Once | ORAL | Status: AC
  Administered 2022-10-13: 1000 mg via ORAL

## 2022-10-13 MED ORDER — SODIUM CHLORIDE 0.9 % IV BOLUS
1000.0000 mL | Freq: Once | INTRAVENOUS | Status: AC
  Administered 2022-10-13: 1000 mL via INTRAVENOUS

## 2022-10-13 MED ORDER — LEVOFLOXACIN 750 MG PO TABS
750.0000 mg | ORAL_TABLET | Freq: Once | ORAL | Status: AC
  Administered 2022-10-13: 750 mg via ORAL
  Filled 2022-10-13: qty 1

## 2022-10-13 MED ORDER — LEVOFLOXACIN 750 MG PO TABS: 750.0000 mg | ORAL_TABLET | Freq: Every day | ORAL | 0 refills | Status: AC

## 2022-10-13 MED ORDER — ACETAMINOPHEN 500 MG PO TABS
ORAL_TABLET | ORAL | Status: AC
  Filled 2022-10-13: qty 2

## 2022-10-13 MED ORDER — IOHEXOL 350 MG/ML SOLN
100.0000 mL | Freq: Once | INTRAVENOUS | Status: AC | PRN
  Administered 2022-10-13: 100 mL via INTRAVENOUS

## 2022-10-13 NOTE — ED Provider Notes (Signed)
[place] EMERGENCY DEPARTMENT AT [facility] Provider Note   CSN: [id number] Arrival date & time: 10/13/22  0008     History  Chief Complaint  Patient presents with   Post-op Problem   Fever    [name] is a 42 y.o. female.  Patient is a 42 year old female with past medical history of uterine fibroids.  She is 3 days status post robotic assisted hysterectomy performed at [facility].  Patient presenting today with complaints of fever.  She began running a temperature today up to 103.  She describes some coughing that has been nonproductive.  She also describes some lower abdominal soreness, but no severe pain.  She denies ill contacts.  The history is provided by the patient.       Home Medications Prior to Admission medications   Medication Sig Start Date End Date Taking? Authorizing Provider  buPROPion (WELLBUTRIN XL) 150 MG 24 hr tablet Take 150 mg by mouth daily. Patient unsure of dosage.    [provider]  ezetimibe (ZETIA) 10 MG tablet Take 10 mg by mouth daily.    [provider]  ibuprofen (MOTRIN IB) 200 MG tablet Take 3 tablets (600 mg total) by mouth every 6 (six) hours as needed. 06/15/17   Huel Cote, MD  levothyroxine (SYNTHROID) 125 MCG tablet Take 125 mcg by mouth daily before breakfast.    [provider]  Multiple Vitamin (MULTIVITAMIN WITH MINERALS) TABS tablet Take 1 tablet by mouth daily.    [provider]  sertraline (ZOLOFT) 50 MG tablet Take 50 mg by mouth daily. PT TAKES 1 1/2 TABLET    [provider]      Allergies    Azithromycin    Review of Systems   Review of Systems  All other systems reviewed and are negative.   Physical Exam Updated Vital Signs BP 94/61   Pulse (!) 112   Temp (!) 103 F (39.4 C) (Oral)   Resp 19   Wt 65.8 kg   LMP 09/30/2022 (Exact Date)   SpO2 95%   BMI 27.40 kg/m  Physical Exam Vitals and nursing note reviewed.   Constitutional:      General: She is not in acute distress.    Appearance: She is well-developed. She is not diaphoretic.  HENT:     Head: Normocephalic and atraumatic.  Cardiovascular:     Rate and Rhythm: Normal rate and regular rhythm.     Heart sounds: No murmur heard.    No friction rub. No gallop.  Pulmonary:     Effort: Pulmonary effort is normal. No respiratory distress.     Breath sounds: Normal breath sounds. No wheezing.  Abdominal:     General: Bowel sounds are normal. There is no distension.     Palpations: Abdomen is soft.     Tenderness: There is abdominal tenderness. There is no guarding or rebound.     Comments: There is tenderness to palpation in the suprapubic region.  Musculoskeletal:        General: Normal range of motion.     Cervical back: Normal range of motion and neck supple.  Skin:    General: Skin is warm and dry.  Neurological:     General: No focal deficit present.     Mental Status: She is alert and oriented to person, place, and time.     ED Results / Procedures / Treatments   Labs (all labs ordered are listed, but only  abnormal results are displayed) Labs Reviewed  COMPREHENSIVE METABOLIC PANEL - Abnormal; Notable for the following components:      Result Value   Potassium 3.1 (*)    Glucose, Bld 116 (*)    Calcium 7.2 (*)    Total Protein 6.0 (*)    Albumin 2.8 (*)    All other components within normal limits  CBC WITH DIFFERENTIAL/PLATELET - Abnormal; Notable for the following components:   Hemoglobin 9.4 (*)    HCT 29.3 (*)    MCV 75.7 (*)    MCH 24.3 (*)    Neutro Abs 7.9 (*)    All other components within normal limits  SARS CORONAVIRUS 2 BY RT PCR  LACTIC ACID, PLASMA  LACTIC ACID, PLASMA  URINALYSIS, W/ REFLEX TO CULTURE (INFECTION SUSPECTED)    EKG None  Radiology DG Chest Port 1 View  Result Date: 10/13/2022 CLINICAL DATA:  10031 Cough 10031 EXAM: PORTABLE CHEST 1 VIEW COMPARISON:  PetCT 10/05/2006 FINDINGS: The  heart and mediastinal contours are within normal limits. Left upper lobe peripheral airspace opacity. Developing right middle lung zone airspace opacity. No pulmonary edema. No pleural effusion. No pneumothorax. No acute osseous abnormality. IMPRESSION: 1. Left upper lobe peripheral airspace opacity. 2. Developing right middle lung zone airspace opacity. Electronically Signed   By: Tish Frederickson M.D.   On: 10/13/2022 00:58    Procedures Procedures    Medications Ordered in ED Medications  sodium chloride 0.9 % bolus 1,000 mL (has no administration in time range)    ED Course/ Medical Decision Making/ A&P  Patient is a 42 year old female who is 3 days status post robotic assisted hysterectomy.  She presents today with complaints of fever and cough.  This started yesterday and is worsening.  She arrives here with a temp of 103, but no hypoxia.  Her blood pressure is approximately 100 systolic, but vital signs are otherwise stable.  There is no tachycardia.  Physical examination basically unremarkable.  Abdomen is benign.  Workup initiated including CBC, CMP, and lactate.  All studies unremarkable.  There is no leukocytosis, no electrolyte derangement, and lactate is normal.  COVID swab is negative.  Urinalysis inconsistent with infection.  Chest x-ray obtained showing bilateral developing infiltrates.  I also obtained a CTA of the chest, abdomen, and pelvis to rule out both pulmonary embolism and intra-abdominal complication from the surgery.  This showed multifocal pneumonia, but the remainder of the chest and abdomen were normal.  IV fluids administered as well as a dose of Levaquin.  Symptoms most likely related to pneumonia.  This to be treated with Levaquin.  Patient is not hypoxic and is clinically well-appearing.  I feel as though she can safely be discharged with oral antibiotics and as needed return.  Final Clinical Impression(s) / ED Diagnoses Final diagnoses:  None    Rx / DC  Orders ED Discharge Orders     None         Geoffery Lyons, MD 10/13/22 [phone]

## 2022-10-13 NOTE — ED Triage Notes (Addendum)
Pt in with fever, low abdominal pain. Pt had robotic hysterectomy on 9/16, temp 103 in triage - last Ibuprofen at 2300.  Pt also reporting cough since surgery

## 2022-10-13 NOTE — Discharge Instructions (Addendum)
Begin taking Levaquin as prescribed.  Take Tylenol 1000 mg rotated with ibuprofen 600 mg every 4 hours as needed for fever.  Return to the emergency department if you develop severe chest pain, worsening breathing, or for other new and concerning symptoms.

## 2023-02-14 ENCOUNTER — Other Ambulatory Visit: Payer: Self-pay | Admitting: Adult Health
